# Patient Record
Sex: Male | Born: 1940 | Race: White | Marital: Single | Smoking: Never smoker
Health system: Southern US, Community
[De-identification: ages and names within clinical notes are randomized; demographics above are authoritative.]

## PROBLEM LIST (undated history)

## (undated) DIAGNOSIS — E119 Type 2 diabetes mellitus without complications: Secondary | ICD-10-CM

## (undated) DIAGNOSIS — C61 Malignant neoplasm of prostate: Secondary | ICD-10-CM

## (undated) DIAGNOSIS — E785 Hyperlipidemia, unspecified: Secondary | ICD-10-CM

## (undated) DIAGNOSIS — I10 Essential (primary) hypertension: Secondary | ICD-10-CM

## (undated) HISTORY — DX: Type 2 diabetes mellitus without complications: E11.9

## (undated) HISTORY — PX: HERNIA REPAIR: SHX51

## (undated) HISTORY — DX: Essential (primary) hypertension: I10

## (undated) HISTORY — DX: Hyperlipidemia, unspecified: E78.5

---

## 2010-08-19 DIAGNOSIS — E119 Type 2 diabetes mellitus without complications: Secondary | ICD-10-CM | POA: Insufficient documentation

## 2011-01-16 DIAGNOSIS — J309 Allergic rhinitis, unspecified: Secondary | ICD-10-CM | POA: Insufficient documentation

## 2014-09-29 DIAGNOSIS — E781 Pure hyperglyceridemia: Secondary | ICD-10-CM | POA: Insufficient documentation

## 2015-10-08 DIAGNOSIS — E782 Mixed hyperlipidemia: Secondary | ICD-10-CM | POA: Insufficient documentation

## 2016-10-01 DIAGNOSIS — G250 Essential tremor: Secondary | ICD-10-CM | POA: Insufficient documentation

## 2017-09-17 ENCOUNTER — Ambulatory Visit: Payer: Medicare PPO | Admitting: Urology

## 2017-09-17 ENCOUNTER — Encounter: Payer: Self-pay | Admitting: Urology

## 2017-09-17 VITALS — BP 138/77 | HR 68 | Ht 70.0 in | Wt 194.5 lb

## 2017-09-17 DIAGNOSIS — R972 Elevated prostate specific antigen [PSA]: Secondary | ICD-10-CM

## 2017-09-17 NOTE — Progress Notes (Signed)
09/17/2017 1:53 PM   Bobby Nichols 08/11/1941 [id number]  Referring provider: No referring provider defined for this encounter.  Chief Complaint  Patient presents with  . Elevated PSA    HPI: The patient is a 76 year old gentleman presents today for transfer of care of his elevated PSA.  He recently moved here from [place].  His PSA history is as below.  He has had a negative prostate biopsy in March 2014.  His PSA continued to rise and he had a prostate MRI in July 2017 which showed PI-RADS 3 and 4 lesions.  He underwent a fusion biopsy in August 2017 which did have one area of atypia.  I only have access to the patient's results and not to his previous physician records   PSA History:  9.66 - 3/15 11.1 - 12/16 16.7 - 6/17 13.8 - 12/17  He also has a history of low testosterone  and has been on testosterone in the past.  He is not currently on testosterone.  His last injection was in May 2018.   PMH: Past Medical History:  Diagnosis Date  . Diabetes mellitus without complication ([place])   . Hyperlipidemia   . Hypertension     Surgical History: Hernia 1957  Home Medications:  Allergies as of 09/17/2017   No Known Allergies     Medication List        Accurate as of 09/17/17  1:53 PM. Always use your most recent med list.          amLODipine 5 MG tablet Commonly known as:  NORVASC Take by mouth.   aspirin EC 81 MG tablet Take 81 mg by mouth daily.   atorvastatin 20 MG tablet Commonly known as:  LIPITOR TK 1 T PO  QHS   BYSTOLIC 20 MG Tabs Generic drug:  Nebivolol HCl TK 1 T PO  QD   metFORMIN 500 MG 24 hr tablet Commonly known as:  GLUCOPHAGE-XR   multivitamin tablet Take 1 tablet by mouth daily.   omeprazole 20 MG capsule Commonly known as:  PRILOSEC TK 1 C PO QD   PREVIDENT 5000 BOOSTER PLUS 1.1 % Pste Generic drug:  Sodium Fluoride       Allergies: No Known Allergies  Family History: Family History  Problem Relation Age of Onset    . Prostate cancer Neg Hx   . Bladder Cancer Neg Hx   . Kidney cancer Neg Hx     Social History:  reports that  has never smoked. he has never used smokeless tobacco. He reports that he does not drink alcohol or use drugs.  ROS: UROLOGY Frequent Urination?: No Hard to postpone urination?: No Burning/pain with urination?: No Get up at night to urinate?: Yes Leakage of urine?: No Urine stream starts and stops?: No Trouble starting stream?: No Do you have to strain to urinate?: No Blood in urine?: No Urinary tract infection?: No Sexually transmitted disease?: No Injury to kidneys or bladder?: No Painful intercourse?: No Weak stream?: No Erection problems?: Yes Penile pain?: No  Gastrointestinal Nausea?: No Vomiting?: No Indigestion/heartburn?: Yes Diarrhea?: No Constipation?: No  Constitutional Fever: No Night sweats?: No Weight loss?: No Fatigue?: No  Skin Skin rash/lesions?: No Itching?: No  Eyes Blurred vision?: No Double vision?: No  Ears/Nose/Throat Sore throat?: No Sinus problems?: No  Hematologic/Lymphatic Swollen glands?: No Easy bruising?: No  Cardiovascular Leg swelling?: No Chest pain?: No  Respiratory Cough?: No Shortness of breath?: No  Endocrine Excessive thirst?: No  Musculoskeletal Back pain?:  No Joint pain?: No  Neurological Headaches?: No Dizziness?: No  Psychologic Depression?: No Anxiety?: No  Physical Exam: BP 138/77 (BP Location: Right Arm, Patient Position: Sitting, Cuff Size: Normal)   Pulse 68   Ht 5\' 10"  (1.778 m)   Wt 194 lb 8 oz (88.2 kg)   BMI 27.91 kg/m   Constitutional:  Alert and oriented, No acute distress. HEENT: [place] AT, moist mucus membranes.  Trachea midline, no masses. Cardiovascular: No clubbing, cyanosis, or edema. Respiratory: Normal respiratory effort, no increased work of breathing. GI: Abdomen is soft, nontender, nondistended, no abdominal masses GU: No CVA tenderness.  Normal phallus.   Testicles are symmetrical bilaterally.  Benign.  DRE: 2+ benign. Skin: No rashes, bruises or suspicious lesions. Lymph: No cervical or inguinal adenopathy. Neurologic: Grossly intact, no focal deficits, moving all 4 extremities. Psychiatric: Normal mood and affect.  Laboratory Data: No results found for: WBC, HGB, HCT, MCV, PLT  No results found for: CREATININE  No results found for: PSA  No results found for: TESTOSTERONE  No results found for: HGBA1C  Urinalysis No results found for: COLORURINE, APPEARANCEUR, LABSPEC, PHURINE, GLUCOSEU, HGBUR, BILIRUBINUR, KETONESUR, PROTEINUR, UROBILINOGEN, NITRITE, LEUKOCYTESUR   Assessment & Plan:    1. Elevated PSA We will continue the patient's previous plan of checking his PSA every 6 months.  He is due today.  His PSA may in fact go down at this time since he is no longer on testosterone which he was at his last PSA check.  We will continue to monitor it.  If it rises, I would recommend repeating a prostate MRI locally.  2.  Hypogonadism Patient not currently interested in treatment.  If he does change his mind, I would want to get his full medical records regarding his hypogonadism treatment from his previous urologist.   Return in about 6 months (around 03/17/2018) for PSA prior.  Nickie Retort, MD  [facility] [address], [place], [place] [address] [phone]

## 2017-09-18 ENCOUNTER — Telehealth: Payer: Self-pay

## 2017-09-18 LAB — PSA: PROSTATE SPECIFIC AG, SERUM: 10.8 ng/mL — AB (ref 0.0–4.0)

## 2017-09-18 NOTE — Telephone Encounter (Signed)
Nickie Retort, MD  Toniann Fail C, LPN        Please let patient know his PSA lower that it has been in a few years at around 12. F/u as scheduled. Thanks.    Will send a letter.

## 2017-10-22 DIAGNOSIS — K635 Polyp of colon: Secondary | ICD-10-CM | POA: Insufficient documentation

## 2017-10-22 DIAGNOSIS — R251 Tremor, unspecified: Secondary | ICD-10-CM | POA: Insufficient documentation

## 2017-11-02 ENCOUNTER — Other Ambulatory Visit: Payer: Self-pay

## 2017-11-19 DIAGNOSIS — K219 Gastro-esophageal reflux disease without esophagitis: Secondary | ICD-10-CM | POA: Insufficient documentation

## 2017-11-19 DIAGNOSIS — Z1211 Encounter for screening for malignant neoplasm of colon: Secondary | ICD-10-CM | POA: Insufficient documentation

## 2018-03-16 ENCOUNTER — Other Ambulatory Visit: Payer: Medicare PPO

## 2018-03-16 DIAGNOSIS — R972 Elevated prostate specific antigen [PSA]: Secondary | ICD-10-CM

## 2018-03-17 LAB — PSA: Prostate Specific Ag, Serum: 10.6 ng/mL — ABNORMAL HIGH (ref 0.0–4.0)

## 2018-03-18 ENCOUNTER — Ambulatory Visit: Payer: Medicare PPO

## 2018-03-23 ENCOUNTER — Telehealth: Payer: Self-pay

## 2018-03-23 NOTE — Telephone Encounter (Signed)
Pt informed

## 2018-03-23 NOTE — Telephone Encounter (Signed)
-----   Message from Abbie Sons, MD sent at 03/23/2018 12:43 PM EDT ----- PSA stable at 10.6.  Keep scheduled follow-up appointment in July

## 2018-04-08 ENCOUNTER — Ambulatory Visit: Payer: Medicare PPO | Admitting: Urology

## 2018-05-12 ENCOUNTER — Ambulatory Visit: Payer: Medicare PPO | Admitting: Urology

## 2018-05-12 ENCOUNTER — Encounter: Payer: Self-pay | Admitting: Urology

## 2018-05-12 ENCOUNTER — Encounter

## 2018-05-12 VITALS — BP 122/73 | HR 64 | Ht 70.0 in | Wt 197.4 lb

## 2018-05-12 DIAGNOSIS — N4231 Prostatic intraepithelial neoplasia: Secondary | ICD-10-CM | POA: Diagnosis not present

## 2018-05-12 DIAGNOSIS — N4232 Atypical small acinar proliferation of prostate: Secondary | ICD-10-CM | POA: Diagnosis not present

## 2018-05-12 DIAGNOSIS — E291 Testicular hypofunction: Secondary | ICD-10-CM | POA: Diagnosis not present

## 2018-05-12 DIAGNOSIS — R972 Elevated prostate specific antigen [PSA]: Secondary | ICD-10-CM | POA: Insufficient documentation

## 2018-05-12 NOTE — Progress Notes (Signed)
05/12/2018 9:22 AM   Bobby Nichols 05/19/1941 [id number]  Referring provider: No referring provider defined for this encounter.  Chief Complaint  Patient presents with  . Elevated PSA   Urological history: 1. 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  PSA was 13.  Surveillance elected  2.  Hypogonadism   HPI: 77 year old male presents for follow-up of the above problem list.  He saw Dr. Pilar Jarvis in November 2018 and PSA was stable at 10.8.  He elected to discontinue TRT however states he felt much better when he was on replacement primarily better energy level and less fatigue.  He denies bothersome lower urinary tract symptoms.  He has no dysuria or gross hematuria.  PSA performed May 2019 was stable at 10.6.   PMH: Past Medical History:  Diagnosis Date  . Diabetes mellitus without complication ([place])   . Hyperlipidemia   . Hypertension     Surgical History: Past Surgical History:  Procedure Laterality Date  . HERNIA REPAIR      Home Medications:  Allergies as of 05/12/2018   No Known Allergies     Medication List        Accurate as of 05/12/18  9:22 AM. Always use your most recent med list.          amLODipine 5 MG tablet Commonly known as:  NORVASC Take by mouth.   aspirin EC 81 MG tablet Take 81 mg by mouth daily.   atorvastatin 20 MG tablet Commonly known as:  LIPITOR TK 1 T PO  QHS   BYSTOLIC 20 MG Tabs Generic drug:  Nebivolol HCl TK 1 T PO  QD   fluocinonide 0.05 % external solution Commonly known as:  LIDEX APP ON THE SKIN BID PRN   ketoconazole 2 % cream Commonly known as:  NIZORAL APPLY TO FACE BID PRN   metFORMIN 500 MG 24 hr tablet Commonly known as:  GLUCOPHAGE-XR   multivitamin tablet Take 1 tablet by mouth daily.   OMEGA-3 FATTY  ACIDS PO Take 500 mg by mouth.   omeprazole 20 MG capsule Commonly known as:  PRILOSEC TK 1 C PO QD   PREVIDENT 5000 BOOSTER PLUS 1.1 % Pste Generic drug:  Sodium Fluoride   propranolol 40 MG tablet Commonly known as:  INDERAL Take by mouth.       Allergies: No Known Allergies  Family History: Family History  Problem Relation Age of Onset  . Prostate cancer Neg Hx   . Bladder Cancer Neg Hx   . Kidney cancer Neg Hx     Social History:  reports that he has never smoked. He has never used smokeless tobacco. He reports that he does not drink alcohol or use drugs.  ROS: UROLOGY Frequent Urination?: No Hard to postpone urination?: No Burning/pain with urination?: No Get up at night to urinate?: No Leakage of urine?: No Urine stream starts and stops?: No Trouble starting stream?: No Do you have to strain to urinate?: No Blood in urine?: No Urinary tract infection?: No Sexually transmitted disease?: No Injury to kidneys or bladder?: No Painful intercourse?: No Weak stream?: No Erection problems?: Yes Penile pain?: No  Gastrointestinal Nausea?: No Vomiting?: No Indigestion/heartburn?: Yes Diarrhea?:  No Constipation?: No  Constitutional Fever: No Night sweats?: No Weight loss?: No Fatigue?: No  Skin Skin rash/lesions?: No Itching?: No  Eyes Blurred vision?: No Double vision?: No  Ears/Nose/Throat Sore throat?: No Sinus problems?: No  Hematologic/Lymphatic Swollen glands?: No Easy bruising?: No  Cardiovascular Leg swelling?: No Chest pain?: No  Respiratory Cough?: No Shortness of breath?: No  Endocrine Excessive thirst?: No  Musculoskeletal Back pain?: No Joint pain?: No  Neurological Headaches?: No Dizziness?: No  Psychologic Depression?: No Anxiety?: No  Physical Exam: BP 122/73 (BP Location: Left Arm, Patient Position: Sitting, Cuff Size: Normal)   Pulse 64   Ht 5\' 10"  (1.778 m)   Wt 197 lb 6.4 oz (89.5 kg)   BMI 28.32  kg/m   Constitutional:  Alert and oriented, No acute distress. HEENT: [place] AT, moist mucus membranes.  Trachea midline, no masses. Cardiovascular: No clubbing, cyanosis, or edema. Respiratory: Normal respiratory effort, no increased work of breathing. GI: Abdomen is soft, nontender, nondistended, no abdominal masses GU: No CVA tenderness. Prostate 60 g, smooth without nodules lymph: No cervical or inguinal lymphadenopathy. Skin: No rashes, bruises or suspicious lesions. Neurologic: Grossly intact, no focal deficits, moving all 4 extremities. Psychiatric: Normal mood and affect.   Assessment & Plan:   1.  Elevated PSA with high-grade PIN.  He has elected surveillance.  PSA/DRE stable.   2.  Hypogonadism.  He is interested in restarting testosterone replacement.  A testosterone level was drawn and if low he desires to restart office injections.  If he does restart will recheck his PSA 3 months after starting.   Abbie Sons, [place] [address], [place],  [address] [phone]

## 2018-05-13 LAB — TESTOSTERONE: TESTOSTERONE: 135 ng/dL — AB (ref 264–916)

## 2018-05-17 ENCOUNTER — Telehealth: Payer: Self-pay

## 2018-05-17 NOTE — Telephone Encounter (Signed)
Pt informed, please send testosterone to [facility]. He will bring it in for nurse visit.

## 2018-05-17 NOTE — Telephone Encounter (Signed)
-----   Message from Abbie Sons, MD sent at 05/16/2018  9:48 AM EDT ----- Testosterone level is low at 135.  He wanted to start TRT with office injections.  He can start 200 mg every 2 weeks.

## 2018-05-18 MED ORDER — TESTOSTERONE CYPIONATE 200 MG/ML IM SOLN: 200.0000 mg | INTRAMUSCULAR | 0 refills | Status: DC

## 2018-05-18 NOTE — Addendum Note (Signed)
Addended by: Abbie Sons on: 05/18/2018 07:31 AM   Modules accepted: Orders

## 2018-05-18 NOTE — Telephone Encounter (Signed)
rx sent

## 2018-05-25 ENCOUNTER — Telehealth: Payer: Self-pay

## 2018-05-25 NOTE — Telephone Encounter (Signed)
Prior Authorization received for testosterone, submitted via covermymeds.

## 2018-07-05 ENCOUNTER — Telehealth: Payer: Self-pay | Admitting: Urology

## 2018-07-05 NOTE — Telephone Encounter (Signed)
Pt called and they need more information from our office from [facility] besides PA.  They need 3 questions answered.  Have we received this?  Can you please give pt a call [phone]

## 2018-07-08 NOTE — Telephone Encounter (Signed)
I have spoke with pt, I will look for PA information.

## 2018-08-11 ENCOUNTER — Telehealth: Payer: Self-pay | Admitting: Urology

## 2018-08-11 NOTE — Telephone Encounter (Signed)
Pt called again and said he can't get RX.  Do you know if PA has been done?  Please call [phone] for PA

## 2018-08-16 NOTE — Telephone Encounter (Signed)
I contacted them and I am waiting on an approval,   Case Reference: [id number]  Will wait for determination.  Pt informed

## 2018-08-30 ENCOUNTER — Ambulatory Visit (INDEPENDENT_AMBULATORY_CARE_PROVIDER_SITE_OTHER): Payer: Medicare PPO

## 2018-08-30 VITALS — BP 127/77 | HR 65 | Wt 192.0 lb

## 2018-08-30 DIAGNOSIS — E291 Testicular hypofunction: Secondary | ICD-10-CM | POA: Diagnosis not present

## 2018-08-30 MED ORDER — TESTOSTERONE CYPIONATE 200 MG/ML IM SOLN
200.0000 mg | Freq: Once | INTRAMUSCULAR | Status: AC
  Administered 2018-08-30: 200 mg via INTRAMUSCULAR

## 2018-08-30 NOTE — Progress Notes (Signed)
Testosterone IM Injection  Due to Hypogonadism patient is present today for a Testosterone Injection.  Medication: Testosterone Cypionate Dose: 200mg  Location: right upper outer buttocks Lot: [id number] Exp:04/2020  Patient tolerated well, no complications were noted  Preformed by: Raylene Miyamoto (AAMA)  Follow up: 2 weeks

## 2018-08-30 NOTE — Telephone Encounter (Signed)
PA for Testosterone approved. Pt informed.

## 2018-09-13 ENCOUNTER — Ambulatory Visit (INDEPENDENT_AMBULATORY_CARE_PROVIDER_SITE_OTHER): Payer: Medicare PPO

## 2018-09-13 DIAGNOSIS — E291 Testicular hypofunction: Secondary | ICD-10-CM

## 2018-09-13 MED ORDER — TESTOSTERONE CYPIONATE 200 MG/ML IM SOLN
200.0000 mg | Freq: Once | INTRAMUSCULAR | Status: AC
  Administered 2018-09-13: 200 mg via INTRAMUSCULAR

## 2018-09-13 NOTE — Progress Notes (Signed)
Testosterone IM Injection  Due to Hypogonadism patient is present today for a Testosterone Injection.  Medication: Testosterone Cypionate Dose: 200mg / 24ml Location: right upper outer buttocks Lot: [id number] Exp:03/2020  Patient tolerated well, no complications were noted  Preformed by: Fonnie Jarvis, CMA  Follow up: 2 weeks

## 2018-09-27 ENCOUNTER — Ambulatory Visit (INDEPENDENT_AMBULATORY_CARE_PROVIDER_SITE_OTHER): Payer: Medicare PPO

## 2018-09-27 DIAGNOSIS — E291 Testicular hypofunction: Secondary | ICD-10-CM

## 2018-09-27 MED ORDER — TESTOSTERONE CYPIONATE 200 MG/ML IM SOLN
200.0000 mg | Freq: Once | INTRAMUSCULAR | Status: AC
  Administered 2018-09-27: 200 mg via INTRAMUSCULAR

## 2018-09-27 NOTE — Progress Notes (Signed)
Due to Hypogonadism patient is present today for a Testosterone Injection.  Medication: Testosterone Cypionate Dose: 200mg / 88ml Location: right upper outer buttocks Lot: [id number] Exp:02/2019  Patient tolerated well, no complications were noted, patient requested right side again  Preformed by: Fonnie Jarvis, CMA  Follow up: 2 weeks

## 2018-10-22 ENCOUNTER — Ambulatory Visit (INDEPENDENT_AMBULATORY_CARE_PROVIDER_SITE_OTHER): Payer: Medicare PPO

## 2018-10-22 VITALS — BP 132/79 | HR 69 | Wt 202.0 lb

## 2018-10-22 DIAGNOSIS — E291 Testicular hypofunction: Secondary | ICD-10-CM

## 2018-10-22 MED ORDER — TESTOSTERONE CYPIONATE 200 MG/ML IM SOLN
200.0000 mg | Freq: Once | INTRAMUSCULAR | Status: AC
  Administered 2018-10-22: 200 mg via INTRAMUSCULAR

## 2018-10-22 NOTE — Progress Notes (Signed)
Testosterone IM Injection  Patient is present today for an IM Injection for treatment of Hypogonadism. Drug: Testosterone Cypionate Dose:200mg  Location:left upper outer buttocks Lot: [id number] Exp:02/2019 Patient tolerated well, no complications were noted  Preformed by: Elizabeth Palau, CMA(AAMA)  Additional notes/ Follow up: as directed.

## 2018-10-28 DIAGNOSIS — C4441 Basal cell carcinoma of skin of scalp and neck: Secondary | ICD-10-CM

## 2018-10-28 DIAGNOSIS — C44619 Basal cell carcinoma of skin of left upper limb, including shoulder: Secondary | ICD-10-CM

## 2018-10-28 HISTORY — DX: Basal cell carcinoma of skin of left upper limb, including shoulder: C44.619

## 2018-10-28 HISTORY — DX: Basal cell carcinoma of skin of scalp and neck: C44.41

## 2018-11-05 ENCOUNTER — Ambulatory Visit (INDEPENDENT_AMBULATORY_CARE_PROVIDER_SITE_OTHER): Payer: Medicare PPO | Admitting: Family Medicine

## 2018-11-05 DIAGNOSIS — E291 Testicular hypofunction: Secondary | ICD-10-CM | POA: Diagnosis not present

## 2018-11-05 NOTE — Progress Notes (Signed)
Testosterone IM Injection  Due to Hypogonadism patient is present today for a Testosterone Injection.  Medication: Testosterone Cypionate Dose: 1ML Location: left upper outer buttocks Lot: [id number] Exp:06/2020  Patient tolerated well, no complications were noted  Preformed by: Elberta Leatherwood, CMA

## 2018-11-19 ENCOUNTER — Ambulatory Visit (INDEPENDENT_AMBULATORY_CARE_PROVIDER_SITE_OTHER): Payer: Medicare PPO | Admitting: Family Medicine

## 2018-11-19 DIAGNOSIS — E291 Testicular hypofunction: Secondary | ICD-10-CM | POA: Diagnosis not present

## 2018-11-19 MED ORDER — TESTOSTERONE CYPIONATE 200 MG/ML IM SOLN
200.0000 mg | Freq: Once | INTRAMUSCULAR | Status: AC
  Administered 2018-11-19: 200 mg via INTRAMUSCULAR

## 2018-11-19 NOTE — Progress Notes (Signed)
Testosterone IM Injection  Due to Hypogonadism patient is present today for a Testosterone Injection.  Medication: Testosterone Cypionate Dose: 1ML Location: right upper outer buttocks Lot: [id number] Exp:03/2020  Patient tolerated well, no complications were noted  Preformed by: Elberta Leatherwood, CMA  Follow up: 2 weeks

## 2018-11-30 ENCOUNTER — Other Ambulatory Visit: Payer: Self-pay | Admitting: Family Medicine

## 2018-12-02 ENCOUNTER — Telehealth: Payer: Self-pay | Admitting: Urology

## 2018-12-02 MED ORDER — TESTOSTERONE CYPIONATE 200 MG/ML IM SOLN: 200.0000 mg | INTRAMUSCULAR | 0 refills | Status: DC

## 2018-12-02 NOTE — Telephone Encounter (Signed)
Pt states he has appt tomorrow for testosterone injection, however he is out of his medication and states the pharmacy has contacted [place]. Please advise pt what he needs to do, is Rx going to be called in before tomorrow at 3pm? Please call pt @ [phone]

## 2018-12-02 NOTE — Telephone Encounter (Signed)
He has not had a testosterone level checked since starting TRT in November.  He needs a testosterone level prior to his injection tomorrow.

## 2018-12-03 ENCOUNTER — Ambulatory Visit (INDEPENDENT_AMBULATORY_CARE_PROVIDER_SITE_OTHER): Payer: Medicare PPO | Admitting: Family Medicine

## 2018-12-03 DIAGNOSIS — E291 Testicular hypofunction: Secondary | ICD-10-CM | POA: Diagnosis not present

## 2018-12-03 MED ORDER — TESTOSTERONE CYPIONATE 200 MG/ML IM SOLN
200.0000 mg | Freq: Once | INTRAMUSCULAR | Status: AC
  Administered 2018-12-03: 200 mg via INTRAMUSCULAR

## 2018-12-03 NOTE — Telephone Encounter (Signed)
Patient seen in office

## 2018-12-03 NOTE — Progress Notes (Signed)
Testosterone IM Injection  Due to Hypogonadism patient is present today for a Testosterone Injection.  Medication: Testosterone Cypionate Dose: 1ML Location: left upper outer buttocks Lot: [id number] Exp:07/2020  Patient tolerated well, no complications were noted  Preformed by: Elberta Leatherwood, CMA  Follow up: 2 weeks

## 2018-12-17 ENCOUNTER — Telehealth: Payer: Self-pay | Admitting: Urology

## 2018-12-17 ENCOUNTER — Ambulatory Visit: Payer: Medicare PPO

## 2018-12-17 ENCOUNTER — Ambulatory Visit (INDEPENDENT_AMBULATORY_CARE_PROVIDER_SITE_OTHER): Payer: Medicare PPO | Admitting: Urology

## 2018-12-17 DIAGNOSIS — E291 Testicular hypofunction: Secondary | ICD-10-CM | POA: Diagnosis not present

## 2018-12-17 MED ORDER — TESTOSTERONE CYPIONATE 200 MG/ML IM SOLN
200.0000 mg | Freq: Once | INTRAMUSCULAR | Status: AC
  Administered 2018-12-17: 200 mg via INTRAMUSCULAR

## 2018-12-17 NOTE — Telephone Encounter (Signed)
He has an Appt scheduled next week.

## 2018-12-17 NOTE — Progress Notes (Signed)
Testosterone IM Injection  Due to Hypogonadism patient is present today for a Testosterone Injection.  Medication: Testosterone Cypionate Dose: 1Ml Location: right upper outer buttocks Lot: [id number] Exp:07/2020  Patient tolerated well, no complications were noted  Preformed by: Elberta Leatherwood, CMA  Follow up: 2 weeks

## 2018-12-17 NOTE — Telephone Encounter (Signed)
Bobby Nichols needs to have a PSA drawn before anymore testosterone injections.

## 2018-12-20 ENCOUNTER — Other Ambulatory Visit: Payer: Self-pay

## 2018-12-20 DIAGNOSIS — E291 Testicular hypofunction: Secondary | ICD-10-CM

## 2018-12-20 DIAGNOSIS — R972 Elevated prostate specific antigen [PSA]: Secondary | ICD-10-CM

## 2018-12-21 ENCOUNTER — Other Ambulatory Visit: Payer: Medicare PPO

## 2018-12-21 DIAGNOSIS — E291 Testicular hypofunction: Secondary | ICD-10-CM

## 2018-12-21 DIAGNOSIS — R972 Elevated prostate specific antigen [PSA]: Secondary | ICD-10-CM

## 2018-12-22 ENCOUNTER — Telehealth: Payer: Self-pay | Admitting: Family Medicine

## 2018-12-22 DIAGNOSIS — R972 Elevated prostate specific antigen [PSA]: Secondary | ICD-10-CM

## 2018-12-22 DIAGNOSIS — N4232 Atypical small acinar proliferation of prostate: Secondary | ICD-10-CM

## 2018-12-22 DIAGNOSIS — N4231 Prostatic intraepithelial neoplasia: Secondary | ICD-10-CM

## 2018-12-22 LAB — TESTOSTERONE: Testosterone: 1133 ng/dL — ABNORMAL HIGH (ref 264–916)

## 2018-12-22 LAB — PSA: Prostate Specific Ag, Serum: 12.9 ng/mL — ABNORMAL HIGH (ref 0.0–4.0)

## 2018-12-22 NOTE — Telephone Encounter (Signed)
Patient notified to stop Testosterone injections at this time. Please review the PSA results and give instructions on what he should do next.

## 2018-12-22 NOTE — Telephone Encounter (Signed)
-----   Message from Nori Riis, PA-C sent at 12/22/2018  9:47 AM EST ----- Bobby Nichols needs to stop his testosterone injections and we need to run his results by Dr. Bernardo Heater for further instructions.

## 2018-12-23 NOTE — Telephone Encounter (Signed)
If he desires to continue testosterone he will need a repeat mri and possible biopsy. Let me know and I will put in order

## 2018-12-27 NOTE — Telephone Encounter (Signed)
Patient is willing to have the MRI if needed.

## 2018-12-28 NOTE — Telephone Encounter (Signed)
MRI order was entered.  Will call with results

## 2018-12-31 ENCOUNTER — Ambulatory Visit: Payer: Medicare PPO

## 2018-12-31 NOTE — Telephone Encounter (Signed)
Tried [employer] and the recording was not directing to the correct location and hung up. Will call again.

## 2018-12-31 NOTE — Telephone Encounter (Signed)
[employer] called [phone] and requests a call back, they have a clinical question. Case ID # [id number]. Please advise.

## 2019-03-29 ENCOUNTER — Telehealth: Payer: Self-pay | Admitting: Urology

## 2019-03-29 NOTE — Telephone Encounter (Signed)
Pt was having regular injections back in March.  He wants to know what he needs to do next.  He would like to resume injections, but not sure if he would need to come in for blood work first.

## 2019-03-29 NOTE — Telephone Encounter (Signed)
Spoke to patient and he is aware of the need for MRI. An order was place 12/28/18. Can you please look into this.

## 2019-03-29 NOTE — Telephone Encounter (Signed)
LMOM for patient to return call.

## 2019-03-29 NOTE — Telephone Encounter (Signed)
Mr. Bobby Nichols needs to have a prostate MRI prior to restarting any testosterone therapy.

## 2019-03-29 NOTE — Telephone Encounter (Signed)
The MRI order was closed and marked as expired order. I opened it back up and I will need to do a PA first before it can get scheduled. Not sure why is was closed.    Bobby Nichols

## 2019-08-23 ENCOUNTER — Telehealth: Payer: Self-pay | Admitting: Urology

## 2019-08-23 DIAGNOSIS — N4232 Atypical small acinar proliferation of prostate: Secondary | ICD-10-CM

## 2019-08-23 DIAGNOSIS — R972 Elevated prostate specific antigen [PSA]: Secondary | ICD-10-CM

## 2019-08-23 NOTE — Telephone Encounter (Signed)
Patient called asking about restarting his depo injections, I explained that he would still need to have the MRI prior to starting these. The last order expired because he never had the MRI done. He said due to [place] he never followed through. He now wants to proceed and so I will need a new order for the MRI.   Thanks, Sharyn Lull

## 2019-09-01 NOTE — Telephone Encounter (Signed)
MRI was approved and patient is aware Scheduling will call him to schedule and he will call me back to schedule his follow up with [facility]

## 2019-09-21 ENCOUNTER — Ambulatory Visit
Admission: RE | Admit: 2019-09-21 | Discharge: 2019-09-21 | Disposition: A | Discharge status: Home / Self Care | Payer: Medicare PPO | Source: Ambulatory Visit | Attending: Urology | Admitting: Urology

## 2019-09-21 ENCOUNTER — Other Ambulatory Visit: Payer: Self-pay

## 2019-09-21 DIAGNOSIS — R972 Elevated prostate specific antigen [PSA]: Secondary | ICD-10-CM | POA: Insufficient documentation

## 2019-09-21 DIAGNOSIS — N4232 Atypical small acinar proliferation of prostate: Secondary | ICD-10-CM | POA: Insufficient documentation

## 2019-09-21 LAB — POCT I-STAT CREATININE: Creatinine, Ser: 1.1 mg/dL (ref 0.61–1.24)

## 2019-09-21 MED ORDER — GADOBUTROL 1 MMOL/ML IV SOLN
9.0000 mL | Freq: Once | INTRAVENOUS | Status: AC | PRN
  Administered 2019-09-21: 9 mL via INTRAVENOUS

## 2019-09-28 ENCOUNTER — Ambulatory Visit: Payer: Medicare PPO | Admitting: Urology

## 2019-10-03 ENCOUNTER — Telehealth: Payer: Self-pay | Admitting: Urology

## 2019-10-03 DIAGNOSIS — R972 Elevated prostate specific antigen [PSA]: Secondary | ICD-10-CM

## 2019-10-03 DIAGNOSIS — R935 Abnormal findings on diagnostic imaging of other abdominal regions, including retroperitoneum: Secondary | ICD-10-CM

## 2019-10-03 NOTE — Telephone Encounter (Signed)
I contacted Bobby Nichols regarding his recent prostate MRI results.  There was a PI-RADS 5 lesion in the right central gland and a PI-RADS 3 lesion in the left central gland.  There was capsular bulging in the area of PI-RADS 5 lesion.  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  I recommended scheduling a repeat fusion biopsy.

## 2019-10-26 ENCOUNTER — Other Ambulatory Visit: Payer: Self-pay

## 2019-10-26 ENCOUNTER — Ambulatory Visit: Payer: Medicare Other | Admitting: Urology

## 2019-10-26 VITALS — BP 131/71 | HR 73 | Ht 70.0 in | Wt 198.0 lb

## 2019-10-26 DIAGNOSIS — R935 Abnormal findings on diagnostic imaging of other abdominal regions, including retroperitoneum: Secondary | ICD-10-CM | POA: Diagnosis not present

## 2019-10-26 DIAGNOSIS — R972 Elevated prostate specific antigen [PSA]: Secondary | ICD-10-CM | POA: Diagnosis not present

## 2019-10-26 DIAGNOSIS — N4231 Prostatic intraepithelial neoplasia: Secondary | ICD-10-CM

## 2019-10-26 DIAGNOSIS — N4232 Atypical small acinar proliferation of prostate: Secondary | ICD-10-CM | POA: Diagnosis not present

## 2019-10-26 NOTE — Progress Notes (Signed)
10/26/2019 11:40 AM   Bobby Nichols [birth year] [id number]  Referring provider: Baxter Hire, MD [place],  [place] [address]  Chief Complaint  Patient presents with  . Results    MRI    Urological history: 1. 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  PSA was 13.  Surveillance elected  2.  Hypogonadism   HPI: 79 y.o. male presents for follow-up of an elevated PSA and MRI results.  His testosterone was held after rising PSA was documented March 2020.  Prostate MRI performed 12/2 was remarkable for a 38 cc prostate.  A PI-RADS 5 lesion was noted in the anterior right mid gland with associated capsular bulging.  There was also a PI-RADS 3 lesion in the left mid gland.  I had contacted him in mid December and recommended a repeat fusion biopsy.  He states he is scheduled in February.   PMH: Past Medical History:  Diagnosis Date  . Diabetes mellitus without complication ([place])   . Hyperlipidemia   . Hypertension     Surgical History: Past Surgical History:  Procedure Laterality Date  . HERNIA REPAIR      Home Medications:  Allergies as of 10/26/2019   No Known Allergies     Medication List       Accurate as of October 26, 2019 11:40 AM. If you have any questions, ask your nurse or doctor.        STOP taking these medications   Bystolic 20 MG Tabs Generic drug: Nebivolol HCl Stopped by: Abbie Sons, MD   metFORMIN 500 MG 24 hr tablet Commonly known as: GLUCOPHAGE-XR Stopped by: Abbie Sons, MD     TAKE these medications   amLODipine 5 MG tablet Commonly known as: NORVASC Take by mouth.   aspirin EC 81 MG tablet Take 81 mg by mouth daily.   atorvastatin 20 MG tablet Commonly known as: LIPITOR TK 1 T PO  QHS   fluocinonide 0.05 %  external solution Commonly known as: LIDEX APP ON THE SKIN BID PRN   ketoconazole 2 % cream Commonly known as: NIZORAL APPLY TO FACE BID PRN   multivitamin tablet Take 1 tablet by mouth daily.   OMEGA-3 FATTY ACIDS PO Take 500 mg by mouth.   omeprazole 20 MG capsule Commonly known as: PRILOSEC TK 1 C PO QD   PreviDent 5000 Booster Plus 1.1 % Pste Generic drug: Sodium Fluoride   propranolol 40 MG tablet Commonly known as: INDERAL Take by mouth.   testosterone cypionate 200 MG/ML injection Commonly known as: DEPOTESTOSTERONE CYPIONATE Inject 1 mL (200 mg total) into the muscle every 14 (fourteen) days.       Allergies: No Known Allergies  Family History: Family History  Problem Relation Age of Onset  . Prostate cancer Neg Hx   . Bladder Cancer Neg Hx   . Kidney cancer Neg Hx     Social History:  reports that he has never smoked. He has never used smokeless tobacco. He reports that he does not drink alcohol or use drugs.  ROS: UROLOGY Frequent Urination?: No Hard to postpone urination?: No Burning/pain with urination?: No Get up at night to urinate?: No Leakage of urine?: No Urine stream starts and  stops?: No Trouble starting stream?: No Do you have to strain to urinate?: No Blood in urine?: No Urinary tract infection?: No Sexually transmitted disease?: No Injury to kidneys or bladder?: No Painful intercourse?: No Weak stream?: No Erection problems?: No Penile pain?: No  Gastrointestinal Nausea?: No Vomiting?: No Indigestion/heartburn?: No Diarrhea?: No Constipation?: No  Constitutional Fever: No Night sweats?: No Weight loss?: No Fatigue?: No  Skin Skin rash/lesions?: No Itching?: No  Eyes Blurred vision?: No Double vision?: No  Ears/Nose/Throat Sore throat?: No Sinus problems?: No  Hematologic/Lymphatic Swollen glands?: No Easy bruising?: No  Cardiovascular Leg swelling?: No Chest pain?: No  Respiratory Cough?:  No Shortness of breath?: No  Endocrine Excessive thirst?: No  Musculoskeletal Back pain?: No Joint pain?: No  Neurological Headaches?: No Dizziness?: No  Psychologic Depression?: No Anxiety?: No  Physical Exam: BP 131/71   Pulse 73   Ht 5\' 10"  (1.778 m)   Wt 198 lb (89.8 kg)   BMI 28.41 kg/m   Constitutional:  Alert and oriented, No acute distress. HEENT: [place] AT, moist mucus membranes.  Trachea midline, no masses. Cardiovascular: No clubbing, cyanosis, or edema. Respiratory: Normal respiratory effort, no increased work of breathing. GI: Abdomen is soft, nontender, nondistended, no abdominal masses    Assessment & Plan:   80 y.o. 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  Recent MRI does show a PI-RADS 5 lesion with capsular bulging.  He is scheduled for MR fusion biopsy in [place] next month.  All questions were answered and he will return here for biopsy results.  Abbie Sons, [place] [address] [place], [place] [address] [phone]

## 2019-10-29 ENCOUNTER — Encounter: Payer: Self-pay | Admitting: Urology

## 2019-10-29 DIAGNOSIS — R935 Abnormal findings on diagnostic imaging of other abdominal regions, including retroperitoneum: Secondary | ICD-10-CM | POA: Insufficient documentation

## 2019-11-10 ENCOUNTER — Other Ambulatory Visit: Payer: Self-pay | Admitting: Urology

## 2019-11-14 ENCOUNTER — Ambulatory Visit: Payer: Medicare Other | Admitting: Urology

## 2019-11-17 DIAGNOSIS — C44519 Basal cell carcinoma of skin of other part of trunk: Secondary | ICD-10-CM

## 2019-11-17 HISTORY — DX: Basal cell carcinoma of skin of other part of trunk: C44.519

## 2019-11-18 ENCOUNTER — Other Ambulatory Visit: Payer: Self-pay

## 2019-11-18 ENCOUNTER — Ambulatory Visit: Payer: Medicare Other | Admitting: Urology

## 2019-11-18 ENCOUNTER — Encounter: Payer: Self-pay | Admitting: Urology

## 2019-11-18 VITALS — BP 124/77 | HR 66 | Ht 70.0 in | Wt 198.0 lb

## 2019-11-18 DIAGNOSIS — C61 Malignant neoplasm of prostate: Secondary | ICD-10-CM | POA: Diagnosis not present

## 2019-11-18 NOTE — Progress Notes (Signed)
11/18/2019 9:32 AM   Bobby Nichols [birth year] [id number]  Referring provider: Baxter Hire, MD [place],  [place] [address]  Chief Complaint  Patient presents with  . Results    Urological history: 1.Elevated PSA -Benign biopsy 12/2012 for PSA in the 9-10 range -MRI 2017 for PSA 13.8 showing PI-RADS 3 and 4 lesions -Fusion biopsy showed multifocal high-grade PIN and a focus of atypia suspicious for adenocarcinoma.PSA was 13. Surveillance elected -Repeat MRI 12/20, 38 cc gland, PI-RADS 5 lesion right mid gland, PI-RADS 3 lesion left mid gland  2.Hypogonadism  HPI: 79 y.o. male status post MR fusion biopsy at [facility] 11/07/2019.  He had no post biopsy complaints.  Prostate volume by ultrasound was 52.1 g.  4 cores each of ROI lesions were obtained along with 12 core standard biopsies.  Pathology: -1/2 cores positive Gleason 3+4 adenocarcinoma left ROI (20%) -1/2 cores positive Gleason 3+3-second left ROI sample (20%) -14 cores with atypia right ROI -12 core biopsy RML core positive Gleason 3+3 (10%) -LB and RB cores with high-grade PIN  PMH: Past Medical History:  Diagnosis Date  . Diabetes mellitus without complication ([place])   . Hyperlipidemia   . Hypertension     Surgical History: Past Surgical History:  Procedure Laterality Date  . HERNIA REPAIR      Home Medications:  Allergies as of 11/18/2019   No Known Allergies     Medication List       Accurate as of November 18, 2019  9:32 AM. If you have any questions, ask your nurse or doctor.        amLODipine 5 MG tablet Commonly known as: NORVASC Take by mouth.   aspirin EC 81 MG tablet Take 81 mg by mouth daily.   atorvastatin 20 MG tablet Commonly known as: LIPITOR TK 1 T PO  QHS   fluocinonide 0.05 % external solution Commonly known as: LIDEX APP ON THE SKIN BID PRN   ketoconazole 2 % cream Commonly known as: NIZORAL APPLY TO FACE BID PRN   multivitamin  tablet Take 1 tablet by mouth daily.   OMEGA-3 FATTY ACIDS PO Take 500 mg by mouth.   omeprazole 20 MG capsule Commonly known as: PRILOSEC TK 1 C PO QD   PreviDent 5000 Booster Plus 1.1 % Pste Generic drug: Sodium Fluoride   propranolol 40 MG tablet Commonly known as: INDERAL Take by mouth.   testosterone cypionate 200 MG/ML injection Commonly known as: DEPOTESTOSTERONE CYPIONATE Inject 1 mL (200 mg total) into the muscle every 14 (fourteen) days.       Allergies: No Known Allergies  Family History: Family History  Problem Relation Age of Onset  . Prostate cancer Neg Hx   . Bladder Cancer Neg Hx   . Kidney cancer Neg Hx     Social History:  reports that he has never smoked. He has never used smokeless tobacco. He reports that he does not drink alcohol or use drugs.  ROS: No change 10/26/2019  Physical Exam: BP 124/77 (BP Location: Left Arm, Patient Position: Sitting, Cuff Size: Normal)   Pulse 66   Ht 5\' 10"  (1.778 m)   Wt 198 lb (89.8 kg)   BMI 28.41 kg/m   Constitutional:  Alert and oriented, No acute distress.  Assessment & Plan:    - T1c adenocarcinoma prostate The pathology report was discussed with Mr. Kuznetsov.  NCCN risk stratification is intermediate risk favorable.  His life expectancy is 10+ years and management  options were discussed including active surveillance, EBRT/brachytherapy and radical prostatectomy.  Based on his age would not recommend radical prostatectomy.  He has a long history of an elevated PSA with prior biopsies and has initially elected active surveillance.  We will see him back in 6 months for PSA/DRE.  Repeat prostate MRI approximately 10 months and will discuss confirmatory biopsy after MRI.  Greater than 50% of this 15-minute visit was spent counseling the patient.  - Hypogonadism He has held his testosterone until his biopsy and would not recommend TRT with intermediate risk prostate cancer on surveillance.   Abbie Sons, [place] [address] [place], [place] [address] [phone]

## 2020-04-04 ENCOUNTER — Other Ambulatory Visit: Payer: Self-pay

## 2020-04-04 MED ORDER — KETOCONAZOLE 2 % EX CREA: TOPICAL_CREAM | CUTANEOUS | 1 refills | Status: DC

## 2020-05-07 ENCOUNTER — Other Ambulatory Visit: Payer: Medicare Other

## 2020-05-07 ENCOUNTER — Other Ambulatory Visit: Payer: Self-pay

## 2020-05-07 DIAGNOSIS — C61 Malignant neoplasm of prostate: Secondary | ICD-10-CM

## 2020-05-08 LAB — PSA: Prostate Specific Ag, Serum: 15.1 ng/mL — ABNORMAL HIGH (ref 0.0–4.0)

## 2020-05-09 NOTE — Progress Notes (Signed)
05/10/2020 3:20 PM   Bobby Nichols 1941-05-21 [id number]  Referring provider: Baxter Hire, MD [place],  [place] [address] Chief Complaint  Patient presents with  . Prostate Cancer   Urological history: 1.Elevated PSA -Benign biopsy 12/2012 for PSA in the 9-10 range -MRI 2017 for PSA 13.8 showing PI-RADS 3 and 4 lesions -Fusion biopsy showed multifocal high-grade PIN and a focus of atypia suspicious for adenocarcinoma.PSA was 13. Surveillance elected -Repeat MRI 12/20, 38 cc gland, PI-RADS 5 lesion right mid gland, PI-RADS 3 lesion left mid gland  2.Hypogonadism  HPI: Bobby Nichols is a 79 y.o. male with prostate cancer s/p MR fusion biopsy at [facility] 11/07/2019. Patient returns today for a 6 month follow up.  -S/p MR fusion biopsy at [facility] 11/07/2019 with no post biopsy complaints.  -Prostate volume by ultrasound was 52.1 g.  4 cores each of ROI lesions were obtained along with 12 core standard biopsies. -PSA was 15.1 and of 05/07/2020.  Pathology: -1/2 cores positive Gleason 3+4 adenocarcinoma left ROI (20%) -1/2 cores positive Gleason 3+3-second left ROI sample (20%) -14 cores with atypia right ROI -12 core biopsy RML core positive Gleason 3+3 (10%) -LB and RB cores with high-grade PIN  PSA trend:  09/23/2016: 13.8 09/17/2017: 10.8 03/16/2018: 10.6 12/21/2018: 12.9 05/07/2020: 15.1   PMH: Past Medical History:  Diagnosis Date  . Basal cell carcinoma (BCC) of back 11/17/2019   Left upper back  . Basal cell carcinoma (BCC) of left shoulder 10/28/2018   [facility] 11/16/2018  . Basal cell carcinoma (BCC) of right side of neck 10/28/2018   [facility] 11/16/2018  . Diabetes mellitus without complication ([place])   . Hyperlipidemia   . Hypertension     Surgical History: Past Surgical History:  Procedure Laterality Date  . HERNIA REPAIR      Home Medications:  Allergies as of 05/10/2020   No Known Allergies     Medication List        Accurate as of May 10, 2020  3:20 PM. If you have any questions, ask your nurse or doctor.        amLODipine 5 MG tablet Commonly known as: NORVASC Take by mouth.   aspirin EC 81 MG tablet Take 81 mg by mouth daily.   atorvastatin 20 MG tablet Commonly known as: LIPITOR TK 1 T PO  QHS   fluocinonide 0.05 % external solution Commonly known as: LIDEX APP ON THE SKIN BID PRN   ketoconazole 2 % cream Commonly known as: NIZORAL APPLY TO FACE BID PRN   ketoconazole 2 % cream Commonly known as: NIZORAL Apply to the face BID PRN   multivitamin tablet Take 1 tablet by mouth daily.   OMEGA-3 FATTY ACIDS PO Take 500 mg by mouth.   omeprazole 20 MG capsule Commonly known as: PRILOSEC TK 1 C PO QD   PreviDent 5000 Booster Plus 1.1 % Pste Generic drug: Sodium Fluoride   propranolol 40 MG tablet Commonly known as: INDERAL Take by mouth.       Allergies: No Known Allergies  Family History: Family History  Problem Relation Age of Onset  . Prostate cancer Neg Hx   . Bladder Cancer Neg Hx   . Kidney cancer Neg Hx     Social History:  reports that he has never smoked. He has never used smokeless tobacco. He reports that he does not drink alcohol and does not use drugs.   Physical Exam: BP (!) 134/74   Pulse 62  Ht 5\' 10"  (1.778 m)   Wt 195 lb (88.5 kg)   BMI 27.98 kg/m   Constitutional:  Alert and oriented, No acute distress. HEENT: [place] AT, moist mucus membranes.  Trachea midline, no masses. Cardiovascular: No clubbing, cyanosis, or edema. Respiratory: Normal respiratory effort, no increased work of breathing. Skin: No rashes, bruises or suspicious lesions. Neurologic: Grossly intact, no focal deficits, moving all 4 extremities. Psychiatric: Normal mood and affect.   Assessment & Plan:    1. T1c adenocarcinoma prostate -His life expectancy is 10+ years and management options were discussed including active surveillance, EBRT/brachytherapy and radical  prostatectomy.  Based on his age would not recommend radical prostatectomy.  He has a long history of an elevated PSA with prior biopsies and has initially elected active surveillance.  -Recent PSA was 15.1 as of 05/07/2020.Due to slightly rising PSA I did a referral to radiation oncology with Dr. Baruch Gouty for further evaluation. Patient would like to wait 6 months before pursuing this option.  -We will see him back in 6 months for PSA.   [place] [address] [place], [place] [address] [phone]  I, Selena Batten, am acting as a scribe for Dr. Nicki Reaper C. Bayle Calvo,  I have reviewed the above documentation for accuracy and completeness, and I agree with the above.    Abbie Sons, MD

## 2020-05-10 ENCOUNTER — Other Ambulatory Visit: Payer: Self-pay

## 2020-05-10 ENCOUNTER — Ambulatory Visit: Payer: Medicare Other | Admitting: Urology

## 2020-05-10 ENCOUNTER — Encounter: Payer: Self-pay | Admitting: Urology

## 2020-05-10 VITALS — BP 134/74 | HR 62 | Ht 70.0 in | Wt 195.0 lb

## 2020-05-10 DIAGNOSIS — C61 Malignant neoplasm of prostate: Secondary | ICD-10-CM

## 2020-05-13 ENCOUNTER — Encounter: Payer: Self-pay | Admitting: Urology

## 2020-05-14 ENCOUNTER — Other Ambulatory Visit: Payer: Self-pay

## 2020-05-16 ENCOUNTER — Ambulatory Visit: Payer: Self-pay | Admitting: Urology

## 2020-05-17 ENCOUNTER — Encounter: Payer: Self-pay | Admitting: Dermatology

## 2020-07-11 ENCOUNTER — Ambulatory Visit: Payer: Medicare Other | Admitting: Dermatology

## 2020-07-11 ENCOUNTER — Other Ambulatory Visit: Payer: Self-pay

## 2020-07-11 ENCOUNTER — Encounter: Payer: Self-pay | Admitting: Dermatology

## 2020-07-11 DIAGNOSIS — Z85828 Personal history of other malignant neoplasm of skin: Secondary | ICD-10-CM

## 2020-07-11 DIAGNOSIS — L821 Other seborrheic keratosis: Secondary | ICD-10-CM

## 2020-07-11 DIAGNOSIS — L820 Inflamed seborrheic keratosis: Secondary | ICD-10-CM | POA: Diagnosis not present

## 2020-07-11 DIAGNOSIS — L814 Other melanin hyperpigmentation: Secondary | ICD-10-CM

## 2020-07-11 DIAGNOSIS — L570 Actinic keratosis: Secondary | ICD-10-CM | POA: Diagnosis not present

## 2020-07-11 DIAGNOSIS — D229 Melanocytic nevi, unspecified: Secondary | ICD-10-CM | POA: Diagnosis not present

## 2020-07-11 DIAGNOSIS — L578 Other skin changes due to chronic exposure to nonionizing radiation: Secondary | ICD-10-CM

## 2020-07-11 DIAGNOSIS — L738 Other specified follicular disorders: Secondary | ICD-10-CM

## 2020-07-11 DIAGNOSIS — D1800 Hemangioma unspecified site: Secondary | ICD-10-CM

## 2020-07-11 DIAGNOSIS — Z1283 Encounter for screening for malignant neoplasm of skin: Secondary | ICD-10-CM

## 2020-07-11 NOTE — Progress Notes (Signed)
Follow-Up Visit   Subjective  Bobby Nichols is a 79 y.o. male who presents for the following: Annual Exam (Hx BCC - patient has noticed some tenderness/soreness on the left side of his face). The patient presents for Total-Body Skin Exam (TBSE) for skin cancer screening and mole check.  The following portions of the chart were reviewed this encounter and updated as appropriate:  Tobacco  Allergies  Meds  Problems  Med Hx  Surg Hx  Fam Hx     Review of Systems:  No other skin or systemic complaints except as noted in HPI or Assessment and Plan.  Objective  Well appearing patient in no apparent distress; mood and affect are within normal limits.  A full examination was performed including scalp, head, eyes, ears, nose, lips, neck, chest, axillae, abdomen, back, buttocks, bilateral upper extremities, bilateral lower extremities, hands, feet, fingers, toes, fingernails, and toenails. All findings within normal limits unless otherwise noted below.  Objective  Face x 6 (6): Erythematous thin papules/macules with gritty scale.   Objective  Face x 2 (2): Erythematous keratotic or waxy stuck-on papule or plaque.   Objective  Face: Small yellow papules with a central dell.   Assessment & Plan  AK (actinic keratosis) (6) Face x 6  Destruction of lesion - Face x 6 Complexity: simple   Destruction method: cryotherapy   Informed consent: discussed and consent obtained   Timeout:  patient name, date of birth, surgical site, and procedure verified Lesion destroyed using liquid nitrogen: Yes   Region frozen until ice ball extended beyond lesion: Yes   Outcome: patient tolerated procedure well with no complications   Post-procedure details: wound care instructions given    Inflamed seborrheic keratosis (2) Face x 2  Destruction of lesion - Face x 2 Complexity: simple   Destruction method: cryotherapy   Informed consent: discussed and consent obtained   Timeout:  patient  name, date of birth, surgical site, and procedure verified Lesion destroyed using liquid nitrogen: Yes   Region frozen until ice ball extended beyond lesion: Yes   Outcome: patient tolerated procedure well with no complications   Post-procedure details: wound care instructions given    Sebaceous hyperplasia of face Face  Benign, observe.    Skin cancer screening   Lentigines - Scattered tan macules - Discussed due to sun exposure - Benign, observe - Call for any changes  Seborrheic Keratoses - Stuck-on, waxy, tan-brown papules and plaques  - Discussed benign etiology and prognosis. - Observe - Call for any changes  Melanocytic Nevi - Tan-brown and/or pink-flesh-colored symmetric macules and papules - Benign appearing on exam today - Observation - Call clinic for new or changing moles - Recommend daily use of broad spectrum spf 30+ sunscreen to sun-exposed areas.   Hemangiomas - Red papules - Discussed benign nature - Observe - Call for any changes  Actinic Damage - diffuse scaly erythematous macules with underlying dyspigmentation - Recommend daily broad spectrum sunscreen SPF 30+ to sun-exposed areas, reapply every 2 hours as needed.  - Call for new or changing lesions.   History of Basal Cell Carcinoma of the Skin - No evidence of recurrence today - Recommend regular full body skin exams - Recommend daily broad spectrum sunscreen SPF 30+ to sun-exposed areas, reapply every 2 hours as needed.  - Call if any new or changing lesions are noted between office visits  Skin cancer screening performed today.  Return in about 6 months (around 01/08/2021) for UBSE.  I,  Rudell Cobb, CMA, am acting as scribe for Sarina Ser, MD .  Documentation: I have reviewed the above documentation for accuracy and completeness, and I agree with the above.  Sarina Ser, MD

## 2020-07-12 ENCOUNTER — Encounter: Payer: Self-pay | Admitting: Dermatology

## 2020-11-09 ENCOUNTER — Other Ambulatory Visit: Payer: Self-pay

## 2020-11-09 DIAGNOSIS — C61 Malignant neoplasm of prostate: Secondary | ICD-10-CM

## 2020-11-12 ENCOUNTER — Other Ambulatory Visit: Payer: Medicare Other

## 2020-11-12 ENCOUNTER — Other Ambulatory Visit: Payer: Self-pay

## 2020-11-12 DIAGNOSIS — C61 Malignant neoplasm of prostate: Secondary | ICD-10-CM

## 2020-11-13 ENCOUNTER — Telehealth: Payer: Self-pay | Admitting: *Deleted

## 2020-11-13 LAB — PSA: Prostate Specific Ag, Serum: 11.7 ng/mL — ABNORMAL HIGH (ref 0.0–4.0)

## 2020-11-13 NOTE — Telephone Encounter (Signed)
Notified patient as instructed, patient pleased. Discussed follow-up appointments, patient agrees  

## 2020-11-13 NOTE — Telephone Encounter (Signed)
-----   Message from Nori Riis, PA-C sent at 11/13/2020 10:04 AM EST ----- Please let Bobby Nichols that his PSA remains elevated 11.7.  Would he like [place] to refer him to Dr. Donella Stade at this time?

## 2020-11-20 ENCOUNTER — Other Ambulatory Visit: Payer: Self-pay | Admitting: Dermatology

## 2020-12-27 ENCOUNTER — Ambulatory Visit: Payer: Medicare Other | Admitting: Dermatology

## 2020-12-27 ENCOUNTER — Other Ambulatory Visit: Payer: Self-pay

## 2020-12-27 ENCOUNTER — Encounter: Payer: Self-pay | Admitting: Dermatology

## 2020-12-27 DIAGNOSIS — L219 Seborrheic dermatitis, unspecified: Secondary | ICD-10-CM

## 2020-12-27 DIAGNOSIS — Z85828 Personal history of other malignant neoplasm of skin: Secondary | ICD-10-CM

## 2020-12-27 DIAGNOSIS — L578 Other skin changes due to chronic exposure to nonionizing radiation: Secondary | ICD-10-CM

## 2020-12-27 DIAGNOSIS — L720 Epidermal cyst: Secondary | ICD-10-CM | POA: Diagnosis not present

## 2020-12-27 DIAGNOSIS — L570 Actinic keratosis: Secondary | ICD-10-CM

## 2020-12-27 DIAGNOSIS — Z1283 Encounter for screening for malignant neoplasm of skin: Secondary | ICD-10-CM | POA: Diagnosis not present

## 2020-12-27 DIAGNOSIS — L821 Other seborrheic keratosis: Secondary | ICD-10-CM

## 2020-12-27 DIAGNOSIS — D1800 Hemangioma unspecified site: Secondary | ICD-10-CM

## 2020-12-27 DIAGNOSIS — L814 Other melanin hyperpigmentation: Secondary | ICD-10-CM

## 2020-12-27 DIAGNOSIS — L918 Other hypertrophic disorders of the skin: Secondary | ICD-10-CM

## 2020-12-27 DIAGNOSIS — D229 Melanocytic nevi, unspecified: Secondary | ICD-10-CM

## 2020-12-27 MED ORDER — TRIAMCINOLONE ACETONIDE 0.1 % EX CREA: 1.0000 "application " | TOPICAL_CREAM | Freq: Two times a day (BID) | CUTANEOUS | 2 refills | Status: DC | PRN

## 2020-12-27 NOTE — Progress Notes (Signed)
Follow-Up Visit   Subjective  Bobby Nichols is a 80 y.o. male who presents for the following: 6 month upper body exam (Patient states he has a spot on corner right eye, behind right ear, and right temple. Patient reports a small growth right axilla.).  He notes significant itching of his ears which is bothersome. He has dandruff at the scalp for years which responds well to dandruff shampoo.   Patient here for total body skin exam and skin cancer screening.  The following portions of the chart were reviewed this encounter and updated as appropriate:  Tobacco  Allergies  Meds  Problems  Med Hx  Surg Hx  Fam Hx      Objective  Well appearing patient in no apparent distress; mood and affect are within normal limits.  A full examination was performed including scalp, head, eyes, ears, nose, lips, neck, chest, axillae, abdomen, back, buttocks, bilateral upper extremities, bilateral lower extremities, hands, feet, fingers, toes, fingernails, and toenails. All findings within normal limits unless otherwise noted below.  Objective  bilateral ear, bilateral eyebrows: Pink patches with greasy scale at bilateral ear canal   Objective  right lateral lower eyelid x1, preauricular x 2, right cheek x 1 (4): Erythematous thin papules/macules with gritty scale.   Objective  Right Axilla: Fleshy, skin-colored pedunculated papules.    Objective  right nasofacial angle: Smooth pink-white papule  Assessment & Plan  Seborrheic dermatitis bilateral ear, bilateral eyebrows  Chronic condition with duration or expected duration over one year. Condition is bothersome to patient. Currently flared.  No cure, only control. Advised will need to treat intermittently.   Start TMC 0.1 % cream apply to affected areas of bilateral ears and eyebrows twice daily as needed for up to 2 weeks. Avoid applying to face, groin, and axilla. Use as directed. Risk of skin atrophy with long-term use reviewed.  80 g 2 rf  Topical steroids (such as triamcinolone, fluocinolone, fluocinonide, mometasone, clobetasol, halobetasol, betamethasone, hydrocortisone) can cause thinning and lightening of the skin if they are used for too long in the same area. Your physician has selected the right strength medicine for your problem and area affected on the body. Please use your medication only as directed by your physician to prevent side effects.      triamcinolone (KENALOG) 0.1 % - bilateral ear, bilateral eyebrows  Actinic keratosis (4) right lateral lower eyelid x1, preauricular x 2, right cheek x 1  Prior to procedure, discussed risks of blister formation, small wound, skin dyspigmentation, or rare scar following cryotherapy.    Destruction of lesion - right lateral lower eyelid x1, preauricular x 2, right cheek x 1  Destruction method: cryotherapy   Informed consent: discussed and consent obtained   Lesion destroyed using liquid nitrogen: Yes   Cryotherapy cycles:  2 Outcome: patient tolerated procedure well with no complications   Post-procedure details: wound care instructions given    Acrochordon Right Axilla  Benign-appearing.  Observation.  Call clinic for new or changing lesions.  Recommend daily use of broad spectrum spf 30+ sunscreen to sun-exposed areas.    Milia right nasofacial angle  Vs angiofibroma   Benign-appearing.  Observation.  Call clinic for new or changing lesions.  Recommend daily use of broad spectrum spf 30+ sunscreen to sun-exposed areas.    Lentigines Chest  - Scattered tan macules - Due to sun exposure - Benign-appering, observe - Recommend daily broad spectrum sunscreen SPF 30+ to sun-exposed areas, reapply every 2 hours as  needed. - Call for any changes  Seborrheic Keratoses Left side neck, left face  - Stuck-on, waxy, tan-brown papules and plaques  - Discussed benign etiology and prognosis. - Observe - Call for any changes  Melanocytic Nevi -  Tan-brown and/or pink-flesh-colored symmetric macules and papules - Benign appearing on exam today - Observation - Call clinic for new or changing moles - Recommend daily use of broad spectrum spf 30+ sunscreen to sun-exposed areas.   Hemangiomas Right neck - Red papules - Discussed benign nature - Observe - Call for any changes  Actinic Damage - Chronic, secondary to cumulative UV/sun exposure - diffuse scaly erythematous macules with underlying dyspigmentation - Recommend daily broad spectrum sunscreen SPF 30+ to sun-exposed areas, reapply every 2 hours as needed.  - Call for new or changing lesions.  History of Basal Cell Carcinoma of the Skin Right side of neck, left shoulder, left upper back - No evidence of recurrence today - Recommend regular full body skin exams - Recommend daily broad spectrum sunscreen SPF 30+ to sun-exposed areas, reapply every 2 hours as needed.  - Call if any new or changing lesions are noted between office visits   Skin cancer screening performed today.  Return in about 6 months (around 06/29/2021) for tbse recheck aks at face .  I, Ruthell Rummage, CMA, am acting as scribe for Forest Gleason, MD.  Documentation: I have reviewed the above documentation for accuracy and completeness, and I agree with the above.  Forest Gleason, MD

## 2020-12-27 NOTE — Patient Instructions (Addendum)
Cryotherapy Aftercare  . Wash gently with soap and water everyday.   Marland Kitchen Apply Vaseline and Band-Aid daily until healed.  Melanoma ABCDEs  Melanoma is the most dangerous type of skin cancer, and is the leading cause of death from skin disease.  You are more likely to develop melanoma if you:  Have light-colored skin, light-colored eyes, or red or blond hair  Spend a lot of time in the sun  Tan regularly, either outdoors or in a tanning bed  Have had blistering sunburns, especially during childhood  Have a close family member who has had a melanoma  Have atypical moles or large birthmarks  Early detection of melanoma is key since treatment is typically straightforward and cure rates are extremely high if we catch it early.   The first sign of melanoma is often a change in a mole or a new dark spot.  The ABCDE system is a way of remembering the signs of melanoma.  A for asymmetry:  The two halves do not match. B for border:  The edges of the growth are irregular. C for color:  A mixture of colors are present instead of an even brown color. D for diameter:  Melanomas are usually (but not always) greater than 40mm - the size of a pencil eraser. E for evolution:  The spot keeps changing in size, shape, and color.  Please check your skin once per month between visits. You can use a small mirror in front and a large mirror behind you to keep an eye on the back side or your body.   If you see any new or changing lesions before your next follow-up, please call to schedule a visit.  Please continue daily skin protection including broad spectrum sunscreen SPF 30+ to sun-exposed areas, reapplying every 2 hours as needed when you're outdoors.   Staying in the shade or wearing long sleeves, sun glasses (UVA+UVB protection) and wide brim hats (4-inch brim around the entire circumference of the hat) are also recommended for sun protection.   Recommend taking Heliocare sun protection supplement  daily in sunny weather for additional sun protection. For maximum protection on the sunniest days, you can take up to 2 capsules of regular Heliocare OR take 1 capsule of Heliocare Ultra. For prolonged exposure (such as a full day in the sun), you can repeat your dose of the supplement 4 hours after your first dose. Heliocare can be purchased at [facility] or at [email or web address].   Recommend Nicotinamide 500mg  twice per day to lower risk of non-melanoma skin cancer by approximately 25%.   Topical steroids (such as triamcinolone, fluocinolone, fluocinonide, mometasone, clobetasol, halobetasol, betamethasone, hydrocortisone) can cause thinning and lightening of the skin if they are used for too long in the same area. Your physician has selected the right strength medicine for your problem and area affected on the body. Please use your medication only as directed by your physician to prevent side effects.  Avoid applying to face, groin, and axilla. Use as directed. Risk of skin atrophy with long-term use reviewed.

## 2020-12-28 ENCOUNTER — Other Ambulatory Visit: Payer: Self-pay | Admitting: Dermatology

## 2020-12-31 NOTE — Telephone Encounter (Signed)
Patient requesting refill for ketoconazole shampoo. Could not find on patient's current medication list or prior office visit. Routing to provider to verify and approve.

## 2021-03-09 ENCOUNTER — Other Ambulatory Visit: Payer: Self-pay | Admitting: Dermatology

## 2021-06-27 ENCOUNTER — Other Ambulatory Visit: Payer: Self-pay | Admitting: Dermatology

## 2021-07-04 ENCOUNTER — Ambulatory Visit: Payer: Medicare Other | Admitting: Dermatology

## 2021-07-10 ENCOUNTER — Other Ambulatory Visit: Payer: Self-pay

## 2021-07-10 ENCOUNTER — Encounter: Payer: Self-pay | Admitting: Dermatology

## 2021-07-10 ENCOUNTER — Ambulatory Visit: Payer: Medicare Other | Admitting: Dermatology

## 2021-07-10 DIAGNOSIS — L821 Other seborrheic keratosis: Secondary | ICD-10-CM

## 2021-07-10 DIAGNOSIS — L820 Inflamed seborrheic keratosis: Secondary | ICD-10-CM

## 2021-07-10 DIAGNOSIS — L814 Other melanin hyperpigmentation: Secondary | ICD-10-CM

## 2021-07-10 DIAGNOSIS — D229 Melanocytic nevi, unspecified: Secondary | ICD-10-CM

## 2021-07-10 DIAGNOSIS — Z85828 Personal history of other malignant neoplasm of skin: Secondary | ICD-10-CM

## 2021-07-10 DIAGNOSIS — L570 Actinic keratosis: Secondary | ICD-10-CM

## 2021-07-10 DIAGNOSIS — Z1283 Encounter for screening for malignant neoplasm of skin: Secondary | ICD-10-CM | POA: Diagnosis not present

## 2021-07-10 DIAGNOSIS — L578 Other skin changes due to chronic exposure to nonionizing radiation: Secondary | ICD-10-CM

## 2021-07-10 DIAGNOSIS — D1800 Hemangioma unspecified site: Secondary | ICD-10-CM

## 2021-07-10 NOTE — Patient Instructions (Addendum)
Actinic keratoses are precancerous spots that appear secondary to cumulative UV radiation exposure/sun exposure over time. They are chronic with expected duration over 1 year. A portion of actinic keratoses will progress to squamous cell carcinoma of the skin. It is not possible to reliably predict which spots will progress to skin cancer and so treatment is recommended to prevent development of skin cancer.  Recommend daily broad spectrum sunscreen SPF 30+ to sun-exposed areas, reapply every 2 hours as needed.  Recommend staying in the shade or wearing long sleeves, sun glasses (UVA+UVB protection) and wide brim hats (4-inch brim around the entire circumference of the hat). Call for new or changing lesions.   Cryotherapy Aftercare  Wash gently with soap and water everyday.   Apply Vaseline and Band-Aid daily until healed.  Recommend Niacinamide or Nicotinamide 500mg  twice per day to lower risk of non-melanoma skin cancer by approximately 25%. This is usually available at [employer].   Recommend taking Heliocare sun protection supplement daily in sunny weather for additional sun protection. For maximum protection on the sunniest days, you can take up to 2 capsules of regular Heliocare OR take 1 capsule of Heliocare Ultra. For prolonged exposure (such as a full day in the sun), you can repeat your dose of the supplement 4 hours after your first dose. Heliocare can be purchased at [facility] or at [email or web address].     Melanoma ABCDEs  Melanoma is the most dangerous type of skin cancer, and is the leading cause of death from skin disease.  You are more likely to develop melanoma if you: Have light-colored skin, light-colored eyes, or red or blond hair Spend a lot of time in the sun Tan regularly, either outdoors or in a tanning bed Have had blistering sunburns, especially during childhood Have a close family member who has had a melanoma Have atypical moles or large  birthmarks  Early detection of melanoma is key since treatment is typically straightforward and cure rates are extremely high if we catch it early.   The first sign of melanoma is often a change in a mole or a new dark spot.  The ABCDE system is a way of remembering the signs of melanoma.  A for asymmetry:  The two halves do not match. B for border:  The edges of the growth are irregular. C for color:  A mixture of colors are present instead of an even brown color. D for diameter:  Melanomas are usually (but not always) greater than 67mm - the size of a pencil eraser. E for evolution:  The spot keeps changing in size, shape, and color.  Please check your skin once per month between visits. You can use a small mirror in front and a large mirror behind you to keep an eye on the back side or your body.   If you see any new or changing lesions before your next follow-up, please call to schedule a visit.  Please continue daily skin protection including broad spectrum sunscreen SPF 30+ to sun-exposed areas, reapplying every 2 hours as needed when you're outdoors.   Staying in the shade or wearing long sleeves, sun glasses (UVA+UVB protection) and wide brim hats (4-inch brim around the entire circumference of the hat) are also recommended for sun protection.    If you have any questions or concerns for your doctor, please call our main line at [phone] and press option 4 to reach your doctor's medical assistant. If no one answers, please leave a voicemail  as directed and we will return your call as soon as possible. Messages left after 4 pm will be answered the following business day.   You may also send [place] a message via [place]. We typically respond to MyChart messages within 1-2 business days.  For prescription refills, please ask your pharmacy to contact our office. Our fax number is [phone].  If you have an urgent issue when the clinic is closed that cannot wait until the next business  day, you can page your doctor at the number below.    Please note that while we do our best to be available for urgent issues outside of office hours, we are not available 24/7.   If you have an urgent issue and are unable to reach [place], you may choose to seek medical care at your doctor's office, retail clinic, urgent care center, or emergency room.  If you have a medical emergency, please immediately call 911 or go to the emergency department.  Pager Numbers  - Dr. Nehemiah Massed: [phone]  - Dr. Laurence Ferrari: [phone]  - Dr. Nicole Kindred: [phone]  In the event of inclement weather, please call our main line at [phone] for an update on the status of any delays or closures.  Dermatology Medication Tips: Please keep the boxes that topical medications come in in order to help keep track of the instructions about where and how to use these. Pharmacies typically print the medication instructions only on the boxes and not directly on the medication tubes.   If your medication is too expensive, please contact our office at [phone] option 4 or send [place] a message through [place].   We are unable to tell what your co-pay for medications will be in advance as this is different depending on your insurance coverage. However, we may be able to find a substitute medication at lower cost or fill out paperwork to get insurance to cover a needed medication.   If a prior authorization is required to get your medication covered by your insurance company, please allow [place] 1-2 business days to complete this process.  Drug prices often vary depending on where the prescription is filled and some pharmacies may offer cheaper prices.  The website www.goodrx.com contains coupons for medications through different pharmacies. The prices here do not account for what the cost may be with help from insurance (it may be cheaper with your insurance), but the website can give you the price if you did not use any  insurance.  - You can print the associated coupon and take it with your prescription to the pharmacy.  - You may also stop by our office during regular business hours and pick up a GoodRx coupon card.  - If you need your prescription sent electronically to a different pharmacy, notify our office through [facility] or by phone at [phone] option 4.

## 2021-07-10 NOTE — Progress Notes (Signed)
Follow-Up Visit   Subjective  Bobby Nichols is a 80 y.o. male who presents for the following: Follow-up (Patient here today for 6 month tbse. Patient here has a spot by right side if nose , right eyebrow area and states some places behind ears. ). Some of these at the face are irritated.  Patient here for full body skin exam and skin cancer screening.  The following portions of the chart were reviewed this encounter and updated as appropriate:  Tobacco  Allergies  Meds  Problems  Med Hx  Surg Hx  Fam Hx      Review of Systems: No other skin or systemic complaints except as noted in HPI or Assessment and Plan.   Objective  Well appearing patient in no apparent distress; mood and affect are within normal limits.  A full examination was performed including scalp, head, eyes, ears, nose, lips, neck, chest, axillae, abdomen, back, buttocks, bilateral upper extremities, bilateral lower extremities, hands, feet, fingers, toes, fingernails, and toenails. All findings within normal limits unless otherwise noted below.  Left Zygoma x 1, right temple x 1, right helix x 1, right lateral zygoma x 1 (4) Erythematous thin papules/macules with gritty scale.   left temple x 1, right forehead above eyebrow x 1, left temporal scalp x 1, right nasal bridge x 1 (4) Erythematous keratotic or waxy stuck-on papule or plaque.   Assessment & Plan  Actinic keratosis (4) Left Zygoma x 1, right temple x 1, right helix x 1, right lateral zygoma x 1  Actinic keratoses are precancerous spots that appear secondary to cumulative UV radiation exposure/sun exposure over time. They are chronic with expected duration over 1 year. A portion of actinic keratoses will progress to squamous cell carcinoma of the skin. It is not possible to reliably predict which spots will progress to skin cancer and so treatment is recommended to prevent development of skin cancer.  Recommend daily broad spectrum sunscreen SPF 30+ to  sun-exposed areas, reapply every 2 hours as needed.  Recommend staying in the shade or wearing long sleeves, sun glasses (UVA+UVB protection) and wide brim hats (4-inch brim around the entire circumference of the hat). Call for new or changing lesions.  Prior to procedure, discussed risks of blister formation, small wound, skin dyspigmentation, or rare scar following cryotherapy. Recommend Vaseline ointment to treated areas while healing.   Destruction of lesion - Left Zygoma x 1, right temple x 1, right helix x 1, right lateral zygoma x 1 Complexity: simple   Destruction method: cryotherapy   Informed consent: discussed and consent obtained   Timeout:  patient name, date of birth, surgical site, and procedure verified Lesion destroyed using liquid nitrogen: Yes   Region frozen until ice ball extended beyond lesion: Yes   Outcome: patient tolerated procedure well with no complications   Post-procedure details: wound care instructions given   Additional details:  Prior to procedure, discussed risks of blister formation, small wound, skin dyspigmentation, or rare scar following cryotherapy. Recommend Vaseline ointment to treated areas while healing.   Inflamed seborrheic keratosis left temple x 1, right forehead above eyebrow x 1, left temporal scalp x 1, right nasal bridge x 1  Prior to procedure, discussed risks of blister formation, small wound, skin dyspigmentation, or rare scar following cryotherapy. Recommend Vaseline ointment to treated areas while healing.   Destruction of lesion - left temple x 1, right forehead above eyebrow x 1, left temporal scalp x 1, right nasal bridge x 1  Destruction method: cryotherapy   Informed consent: discussed and consent obtained   Lesion destroyed using liquid nitrogen: Yes   Cryotherapy cycles:  2 Outcome: patient tolerated procedure well with no complications   Post-procedure details: wound care instructions given   Additional details:  Prior  to procedure, discussed risks of blister formation, small wound, skin dyspigmentation, or rare scar following cryotherapy. Recommend Vaseline ointment to treated areas while healing.        Lentigines - Scattered tan macules - Due to sun exposure - Benign-appearing, observe - Recommend daily broad spectrum sunscreen SPF 30+ to sun-exposed areas, reapply every 2 hours as needed. - Call for any changes  Seborrheic Keratoses - Stuck-on, waxy, tan-brown papules and/or plaques  - Benign-appearing - Discussed benign etiology and prognosis. - Observe - Call for any changes  Melanocytic Nevi - Tan-brown and/or pink-flesh-colored symmetric macules and papules - Benign appearing on exam today - Observation - Call clinic for new or changing moles - Recommend daily use of broad spectrum spf 30+ sunscreen to sun-exposed areas.   Hemangiomas - Red papules - Discussed benign nature - Observe - Call for any changes  Actinic Damage - Chronic condition, secondary to cumulative UV/sun exposure - diffuse scaly erythematous macules with underlying dyspigmentation - Recommend daily broad spectrum sunscreen SPF 30+ to sun-exposed areas, reapply every 2 hours as needed.  - Staying in the shade or wearing long sleeves, sun glasses (UVA+UVB protection) and wide brim hats (4-inch brim around the entire circumference of the hat) are also recommended for sun protection.  - Call for new or changing lesions.  History of Basal Cell Carcinoma of the Skin - No evidence of recurrence today - Recommend regular full body skin exams - Recommend daily broad spectrum sunscreen SPF 30+ to sun-exposed areas, reapply every 2 hours as needed.  - Call if any new or changing lesions are noted between office visits - Recommend Niacinamide or Nicotinamide 500mg  twice per day to lower risk of non-melanoma skin cancer by approximately 25%. This is usually available at [employer].  Skin cancer screening performed  today.  Return for 4 month ak follow up, 1 year tbse . I, Ruthell Rummage, CMA, am acting as scribe for Forest Gleason, MD.  Documentation: I have reviewed the above documentation for accuracy and completeness, and I agree with the above.  Forest Gleason, MD

## 2021-07-11 ENCOUNTER — Ambulatory Visit: Payer: Medicare Other | Admitting: Dermatology

## 2021-07-25 IMAGING — MR MR PROSTATE WO/W CM
18 series · 48 of 48 positions shown · IV contrast (9ml Gadavist)
Comparison: None.

CLINICAL DATA: PSA greater than 3. Prior benign workup. History of
hypogonadism with testosterone injection.

EXAM:
MR PROSTATE WITHOUT AND WITH CONTRAST
TECHNIQUE: Multiplanar multisequence MRI images were obtained of the pelvis
centered about the prostate. Pre and post contrast images were
obtained.
CONTRAST:  9mL GADAVIST GADOBUTROL 1 MMOL/ML IV SOLN

[Series 6: T2 · sagittal · 3.5mm · 0.62mm/px · 2 of 35 slices shown]
[im 1/35]
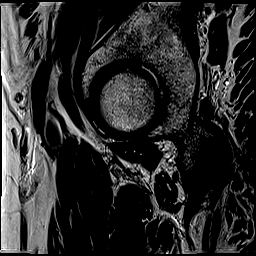
[im 35/35]
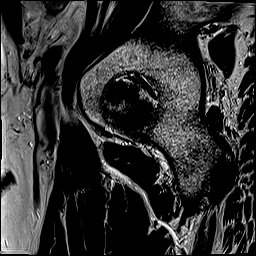

[Series 7: T1 · axial · 3.0mm · 0.35mm/px · z∈[-75,+27]mm · 2 of 30 slices shown (1 of 17)]
[im 1/30]
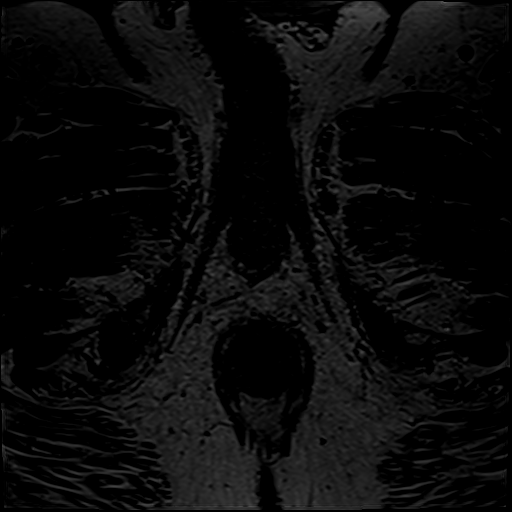
[im 30/30]
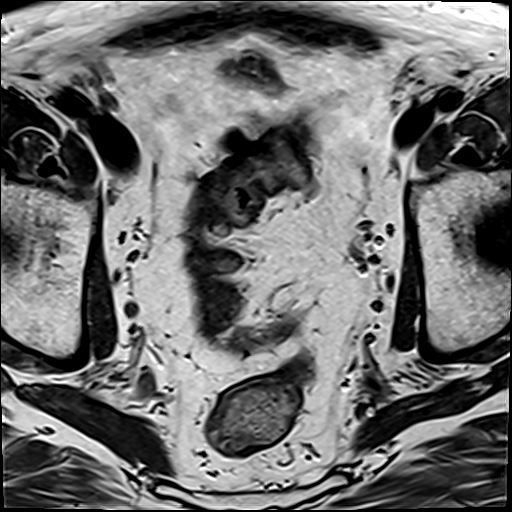

[Series 13: T1 · axial · 3.0mm · 1.15mm/px · z∈[-69,+12]mm · 2 of 28 slices shown (2 of 17)]
[im 1/28]
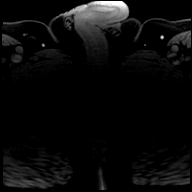
[im 28/28]
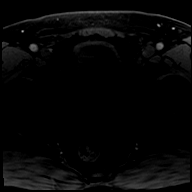

[Series 15: T1 · axial · 3.0mm · 1.15mm/px · z∈[-69,+12]mm · 2 of 28 slices shown (3 of 17)]
[im 1/28]
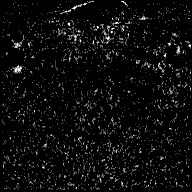
[im 28/28]
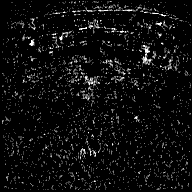

[Series 18: T1 · axial · 3.0mm · 1.15mm/px · z∈[-69,+12]mm · 2 of 28 slices shown (4 of 17)]
[im 1/28]
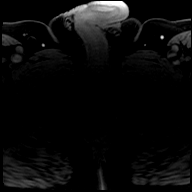
[im 28/28]
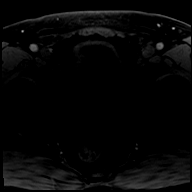

[Series 25: T1 · axial · 3.0mm · 1.15mm/px · z∈[-69,+12]mm · 2 of 28 slices shown (5 of 17)]
[im 1/28]
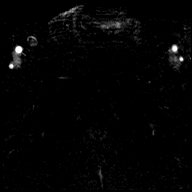
[im 28/28]
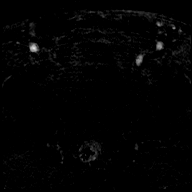

[Series 29: T1 · axial · 3.0mm · 1.15mm/px · z∈[-69,+12]mm · 3 of 28 slices shown (6 of 17)]
[im 1/28]
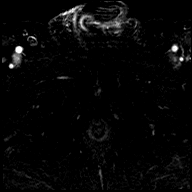
[im 14/28]
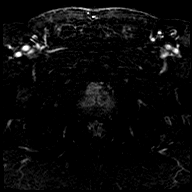
[im 28/28]
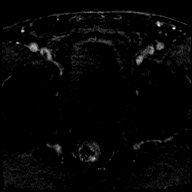

[Series 32: T1 · axial · 3.0mm · 1.15mm/px · z∈[-69,+12]mm · 3 of 28 slices shown (7 of 17)]
[im 1/28]
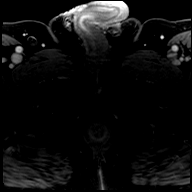
[im 14/28]
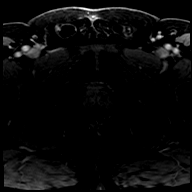
[im 28/28]
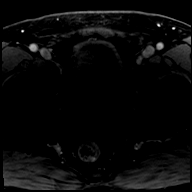

[Series 36: T1 · axial · 3.0mm · 1.15mm/px · z∈[-69,+12]mm · 3 of 28 slices shown (8 of 17)]
[im 1/28]
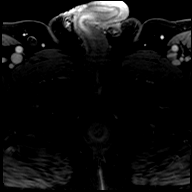
[im 14/28]
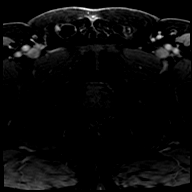
[im 28/28]
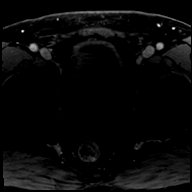

[Series 37: T1 · axial · 3.0mm · 1.15mm/px · z∈[-69,+12]mm · 3 of 28 slices shown (9 of 17)]
[im 1/28]
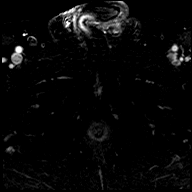
[im 14/28]
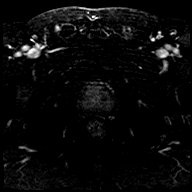
[im 28/28]
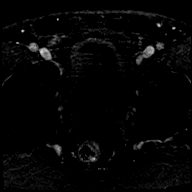

[Series 40: T1 · axial · 3.0mm · 1.15mm/px · z∈[-69,+12]mm · 3 of 28 slices shown (10 of 17)]
[im 1/28]
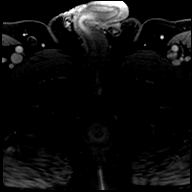
[im 14/28]
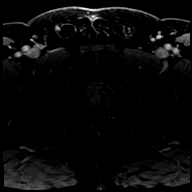
[im 28/28]
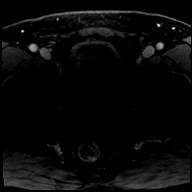

[Series 42: T1 · axial · 3.0mm · 1.15mm/px · z∈[-69,+12]mm · 3 of 28 slices shown (11 of 17)]
[im 1/28]
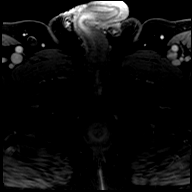
[im 14/28]
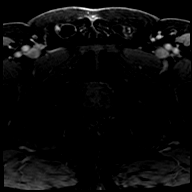
[im 28/28]
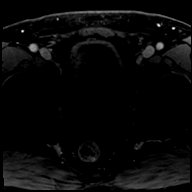

[Series 46: T1 · axial · 3.0mm · 1.15mm/px · z∈[-69,+12]mm · 3 of 28 slices shown (12 of 17)]
[im 1/28]
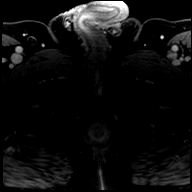
[im 14/28]
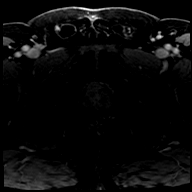
[im 28/28]
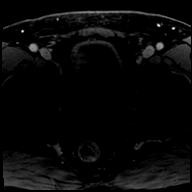

[Series 47: T1 · axial · 3.0mm · 1.15mm/px · z∈[-69,+12]mm · 3 of 28 slices shown (13 of 17)]
[im 1/28]
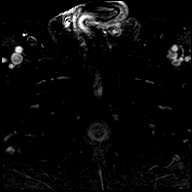
[im 14/28]
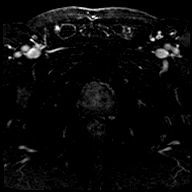
[im 28/28]
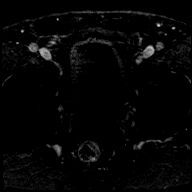

[Series 54: T1 · axial · 3.0mm · 1.15mm/px · z∈[-69,+12]mm · 3 of 28 slices shown (14 of 17)]
[im 1/28]
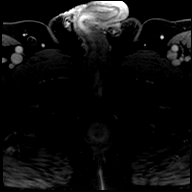
[im 14/28]
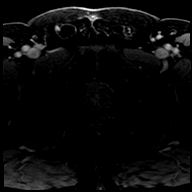
[im 28/28]
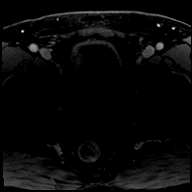

[Series 55: T1 · axial · 3.0mm · 1.15mm/px · z∈[-69,+12]mm · 3 of 28 slices shown (15 of 17)]
[im 1/28]
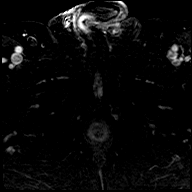
[im 14/28]
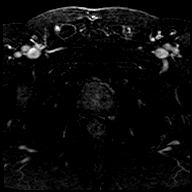
[im 28/28]
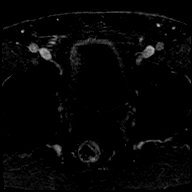

[Series 60: T1 · axial · 3.0mm · 1.15mm/px · z∈[-69,+12]mm · 3 of 28 slices shown (16 of 17)]
[im 1/28]
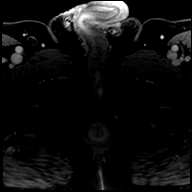
[im 14/28]
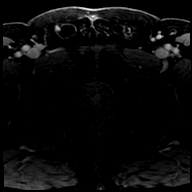
[im 28/28]
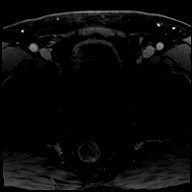

[Series 61: T1 · axial · 3.0mm · 1.15mm/px · z∈[-69,+12]mm · 3 of 28 slices shown (17 of 17)]
[im 1/28]
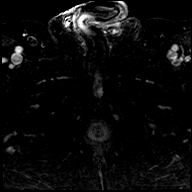
[im 14/28]
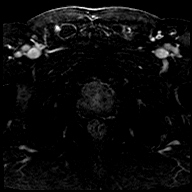
[im 28/28]
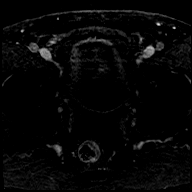

[48 of 48 positions shown; findings below may reference images not displayed]

FINDINGS: Prostate: Demonstrates mild central gland enlargement and
heterogeneity, consistent with benign prostatic hyperplasia. No
suspicious peripheral zone finding.

The most suspicious area is within the anterior right central gland,
mid gland level. This measures on the order of 1.7 x 1.0 cm on [DATE].
Moderately hypointense with ill-defined margins. Corresponds to
restricted diffusion on [DATE]. PI-RADS(v2.1)-5.

Within the anterior left central gland, base level, a 1.0 cm
moderately hypointense nodule has partially obscured and partially
well-defined margins, including on [DATE]. Corresponds to
significantly restricted diffusion on [DATE]. PI-RADS(v2.1)-3.

Volume: 4.3 x 3.7 x 4.6 cm (volume = 38 cm^3)

Transcapsular spread: No gross trans capsular spread. There is mild
bulging of the capsule at the level of the anterior right central
gland lesion including on [DATE].

Seminal vesicle involvement: Absent

Neurovascular bundle involvement: Absent

Pelvic adenopathy: Absent

Bone metastasis: Absent

Other findings: No significant free fluid. Normal urinary bladder. A
tiny fat containing right inguinal hernia.
IMPRESSION: 1. Anterior right mid gland central nodule with ill-defined borders.
PI-RADS(v2.1)-5. Capsular bulging in this region without gross trans
capsular spread.
2. Left mid gland, central nodule with partially obscured margins.
PI-RADS(v2.1)-3.

## 2021-11-13 ENCOUNTER — Other Ambulatory Visit: Payer: Self-pay

## 2021-11-13 ENCOUNTER — Encounter: Payer: Self-pay | Admitting: Dermatology

## 2021-11-13 ENCOUNTER — Ambulatory Visit: Payer: Medicare Other | Admitting: Dermatology

## 2021-11-13 DIAGNOSIS — L570 Actinic keratosis: Secondary | ICD-10-CM

## 2021-11-13 DIAGNOSIS — L578 Other skin changes due to chronic exposure to nonionizing radiation: Secondary | ICD-10-CM | POA: Diagnosis not present

## 2021-11-13 DIAGNOSIS — L821 Other seborrheic keratosis: Secondary | ICD-10-CM

## 2021-11-13 DIAGNOSIS — D485 Neoplasm of uncertain behavior of skin: Secondary | ICD-10-CM

## 2021-11-13 DIAGNOSIS — Z872 Personal history of diseases of the skin and subcutaneous tissue: Secondary | ICD-10-CM | POA: Diagnosis not present

## 2021-11-13 NOTE — Patient Instructions (Addendum)
Wound Care Instructions  Cleanse wound gently with soap and water once a day then pat dry with clean gauze. Apply a thing coat of Petrolatum (petroleum jelly, "Vaseline") over the wound (unless you have an allergy to this). We recommend that you use a new, sterile tube of Vaseline. Do not pick or remove scabs. Do not remove the yellow or white "healing tissue" from the base of the wound.  Cover the wound with fresh, clean, nonstick gauze and secure with paper tape. You may use Band-Aids in place of gauze and tape if the would is small enough, but would recommend trimming much of the tape off as there is often too much. Sometimes Band-Aids can irritate the skin.  You should call the office for your biopsy report after 1 week if you have not already been contacted.  If you experience any problems, such as abnormal amounts of bleeding, swelling, significant bruising, significant pain, or evidence of infection, please call the office immediately.  Recommend daily broad spectrum sunscreen SPF 30+ to sun-exposed areas, reapply every 2 hours as needed. Call for new or changing lesions.  Staying in the shade or wearing long sleeves, sun glasses (UVA+UVB protection) and wide brim hats (4-inch brim around the entire circumference of the hat) are also recommended for sun protection.    If You Need Anything After Your Visit  If you have any questions or concerns for your doctor, please call our main line at [phone] and press option 4 to reach your doctor's medical assistant. If no one answers, please leave a voicemail as directed and we will return your call as soon as possible. Messages left after 4 pm will be answered the following business day.   You may also send [place] a message via [place]. We typically respond to MyChart messages within 1-2 business days.  For prescription refills, please ask your pharmacy to contact our office. Our fax number is [phone].  If you have an urgent issue when the  clinic is closed that cannot wait until the next business day, you can page your doctor at the number below.    Please note that while we do our best to be available for urgent issues outside of office hours, we are not available 24/7.   If you have an urgent issue and are unable to reach [place], you may choose to seek medical care at your doctor's office, retail clinic, urgent care center, or emergency room.  If you have a medical emergency, please immediately call 911 or go to the emergency department.  Pager Numbers  - Dr. Nehemiah Massed: [phone]  - Dr. Laurence Ferrari: [phone]  - Dr. Nicole Kindred: [phone]  In the event of inclement weather, please call our main line at [phone] for an update on the status of any delays or closures.  Dermatology Medication Tips: Please keep the boxes that topical medications come in in order to help keep track of the instructions about where and how to use these. Pharmacies typically print the medication instructions only on the boxes and not directly on the medication tubes.   If your medication is too expensive, please contact our office at [phone] option 4 or send [place] a message through [place].   We are unable to tell what your co-pay for medications will be in advance as this is different depending on your insurance coverage. However, we may be able to find a substitute medication at lower cost or fill out paperwork to get insurance to cover a needed medication.  If a prior authorization is required to get your medication covered by your insurance company, please allow [place] 1-2 business days to complete this process.  Drug prices often vary depending on where the prescription is filled and some pharmacies may offer cheaper prices.  The website www.goodrx.com contains coupons for medications through different pharmacies. The prices here do not account for what the cost may be with help from insurance (it may be cheaper with your insurance), but the  website can give you the price if you did not use any insurance.  - You can print the associated coupon and take it with your prescription to the pharmacy.  - You may also stop by our office during regular business hours and pick up a GoodRx coupon card.  - If you need your prescription sent electronically to a different pharmacy, notify our office through [facility] or by phone at [phone] option 4.     Si Usted Necesita Algo Despus de Su Visita  Tambin puede enviarnos un mensaje a travs de Pharmacist, community. Por lo general respondemos a los mensajes de MyChart en el transcurso de 1 a 2 das hbiles.  Para renovar recetas, por favor pida a su farmacia que se ponga en contacto con nuestra oficina. Harland Dingwall de fax es [place] [phone].  Si tiene un asunto urgente cuando la clnica est cerrada y que no puede esperar hasta el siguiente da hbil, puede llamar/localizar a su doctor(a) al nmero que aparece a continuacin.   Por favor, tenga en cuenta que aunque hacemos todo lo posible para estar disponibles para asuntos urgentes fuera del horario de [place], no estamos disponibles las 24 horas del da, los 7 das de la [place].   Si tiene un problema urgente y no puede comunicarse con nosotros, puede optar por buscar atencin mdica  en el consultorio de su doctor(a), en una clnica privada, en un centro de atencin urgente o en una sala de emergencias.  Si tiene Engineering geologist, por favor llame inmediatamente al 911 o vaya a la sala de emergencias.  Nmeros de bper  - Dr. Nehemiah Massed: [phone]  - Dra. Moye: [phone]  - Dra. Nicole Kindred: [phone]  En caso de inclemencias del [place], por favor llame a Johnsie Kindred principal al [phone] para una actualizacin sobre el [place] de cualquier retraso o cierre.  Consejos para la medicacin en dermatologa: Por favor, guarde las cajas en las que vienen los medicamentos de uso tpico para ayudarle a seguir las  instrucciones sobre dnde y cmo usarlos. Las farmacias generalmente imprimen las instrucciones del medicamento slo en las cajas y no directamente en los tubos del [place].   Si su medicamento es muy caro, por favor, pngase en contacto con Zigmund Daniel llamando al [phone] y presione la opcin 4 o envenos un mensaje a travs de Pharmacist, community.   No podemos decirle cul ser su copago por los medicamentos por adelantado ya que esto es diferente dependiendo de la cobertura de su seguro. Sin embargo, es posible que podamos encontrar un medicamento sustituto a Electrical engineer un formulario para que el seguro cubra el medicamento que se considera necesario.   Si se requiere una autorizacin previa para que su compaa de seguros [place] su medicamento, por favor permtanos de 1 a 2 das hbiles para completar este proceso.  Los precios de los medicamentos varan con frecuencia dependiendo del Environmental consultant de dnde se surte la receta y alguna farmacias pueden ofrecer precios ms baratos.  El sitio web www.goodrx.com  tiene cupones para medicamentos de Airline pilot. Los precios aqu no tienen en cuenta lo que podra costar con la ayuda del seguro (puede ser ms barato con su seguro), pero el sitio web puede darle el precio si no utiliz Research scientist (physical sciences).  - Puede imprimir el cupn correspondiente y llevarlo con su receta a la farmacia.  - Tambin puede pasar por nuestra oficina durante el horario de atencin regular y Charity fundraiser una tarjeta de cupones de GoodRx.  - Si necesita que su receta se enve electrnicamente a una farmacia diferente, informe a nuestra oficina a travs de MyChart de [place] o por telfono llamando al [phone] y presione la opcin 4.

## 2021-11-13 NOTE — Progress Notes (Signed)
° °  Follow-Up Visit   Subjective  Bobby Nichols is a 81 y.o. male who presents for the following: Follow-up (Patient here today for 4 month AK follow up. Patient does advise he has a spot at right nose that is sometimes sore, present for 1-2 months. ).  AK's treated with LN2 at last visit at Left Zygoma x 1, right temple x 1, right helix x 1, right lateral zygoma x 1  The following portions of the chart were reviewed this encounter and updated as appropriate:   Tobacco   Allergies   Meds   Problems   Med Hx   Surg Hx   Fam Hx       Review of Systems:  No other skin or systemic complaints except as noted in HPI or Assessment and Plan.  Objective  Well appearing patient in no apparent distress; mood and affect are within normal limits.  A focused examination was performed including face, ears, neck. Relevant physical exam findings are noted in the Assessment and Plan.  right medial cheek 0.45 cm pink papule       Assessment & Plan  Neoplasm of uncertain behavior of skin right medial cheek  Skin / nail biopsy Type of biopsy: tangential   Informed consent: discussed and consent obtained   Timeout: patient name, date of birth, surgical site, and procedure verified   Patient was prepped and draped in usual sterile fashion: Area prepped with isopropyl alcohol. Anesthesia: the lesion was anesthetized in a standard fashion   Anesthetic:  1% lidocaine w/ epinephrine 1-100,000 buffered w/ 8.4% NaHCO3 Instrument used: DermaBlade   Hemostasis achieved with: aluminum chloride   Outcome: patient tolerated procedure well   Post-procedure details: wound care instructions given   Additional details:  Mupirocin and a bandage applied  Specimen 1 - Surgical pathology Differential Diagnosis: r/o BCC  Check Margins: No 0.45 cm pink papule  If + for [facility] will plan Mohs   History of PreCancerous Actinic Keratosis  - site(s) of PreCancerous Actinic Keratosis clear today. - these may recur  and new lesions may form requiring treatment to prevent transformation into skin cancer - observe for new or changing spots and contact [place] for appointment if occur - photoprotection with sun protective clothing; sunglasses and broad spectrum sunscreen with SPF of at least 30 + and frequent self skin exams recommended - yearly exams by a dermatologist recommended for persons with history of PreCancerous Actinic Keratoses  Actinic Damage - chronic, secondary to cumulative UV radiation exposure/sun exposure over time - diffuse scaly erythematous macules with underlying dyspigmentation - Recommend daily broad spectrum sunscreen SPF 30+ to sun-exposed areas, reapply every 2 hours as needed.  - Recommend staying in the shade or wearing long sleeves, sun glasses (UVA+UVB protection) and wide brim hats (4-inch brim around the entire circumference of the hat). - Call for new or changing lesions.  Seborrheic Keratoses - Stuck-on, waxy, tan-brown papules and/or plaques  - Benign-appearing - Discussed benign etiology and prognosis. - Observe - Call for any changes   Return in about 6 months (around 05/13/2022) for TBSE.  Graciella Belton, RMA, am acting as scribe for Forest Gleason, MD .  Documentation: I have reviewed the above documentation for accuracy and completeness, and I agree with the above.  Forest Gleason, MD

## 2021-11-16 ENCOUNTER — Other Ambulatory Visit: Payer: Self-pay | Admitting: Dermatology

## 2021-11-19 ENCOUNTER — Telehealth: Payer: Self-pay

## 2021-11-19 NOTE — Telephone Encounter (Signed)
Patient advised of BX results and scheduled with Dr. Nicole Kindred to have that place treated. aw

## 2021-11-19 NOTE — Telephone Encounter (Addendum)
Tried calling patient regarding biopsy results and treatment option. No answer. Left message for patient to call office.     ----- Message from Alfonso Patten, MD sent at 11/18/2021 11:08 PM EST ----- Skin , right medial cheek LICHENOID ACTINIC KERATOSIS, BASE INVOLVED ---> LN2 in clinic within next 4 months  MAs please call. Thank you!

## 2021-12-09 ENCOUNTER — Other Ambulatory Visit: Payer: Self-pay

## 2021-12-09 ENCOUNTER — Ambulatory Visit: Payer: Medicare Other | Admitting: Urology

## 2021-12-09 ENCOUNTER — Encounter: Payer: Self-pay | Admitting: Urology

## 2021-12-09 VITALS — BP 154/80 | HR 75 | Ht 70.0 in | Wt 192.0 lb

## 2021-12-09 DIAGNOSIS — C61 Malignant neoplasm of prostate: Secondary | ICD-10-CM | POA: Diagnosis not present

## 2021-12-09 NOTE — Progress Notes (Signed)
05/10/2020 1:52 PM   Bobby Nichols 22-Sep-1941 [id number]  Chief complaint: Follow-up  Urological history: 1.  T1c prostate cancer -Benign biopsy 12/2012 for PSA in the 9-10 range -MRI 2017 for PSA 13.8 showing PI-RADS 3 and 4 lesions -Fusion biopsy showed multifocal high-grade PIN and a focus of atypia suspicious for adenocarcinoma.  PSA was 13.  Surveillance elected -Repeat MRI 12/20, 38 cc gland, PI-RADS 5 lesion right mid gland, PI-RADS 3 lesion left mid gland -s/p MR fusion biopsy at [facility] 11/07/2019 with no post biopsy complaints.  -Prostate volume by ultrasound was 52.1 g.  4 cores each of ROI lesions were obtained along with 12 core standard biopsies. -PSA was 15.1  Pathology: -1/2 cores positive Gleason 3+4 adenocarcinoma left ROI (20%) -1/2 cores positive Gleason 3+3-second left ROI sample (20%) -14 cores with atypia right ROI -12 core biopsy RML core positive Gleason 3+3 (10%) -LB and RB cores with high-grade PIN    2.  Hypogonadism  HPI: Bobby Nichols is a 81 y.o. male with prostate cancer s/p MR fusion biopsy at [facility] 11/07/2019. Patient returns today for a follow-up visit.  He was last seen 05/10/2020.  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 If you have any questions, ask your nurse or doctor.          amLODipine 5 MG tablet Commonly known as: NORVASC Take by mouth.   aspirin EC 81 MG tablet Take 81 mg by mouth daily.   atorvastatin 20 MG tablet Commonly known as: LIPITOR TK 1 T PO  QHS   fluocinonide 0.05 % external solution Commonly known as: LIDEX APP ON THE SKIN BID PRN   ketoconazole 2 % cream Commonly known as: NIZORAL APPLY TO FACE BID PRN   ketoconazole 2 % shampoo Commonly known as: NIZORAL SHAMPOO 3 TIMES A WEEK, LATHER ON SCALP, LEAVE ON 8 TO 10 MINUTES, RINSE WELL   ketoconazole 2 % cream Commonly known as: NIZORAL APPLY TOPICALLY TO FACE TWICE DAILY AS NEEDED   multivitamin tablet Take 1 tablet by mouth daily.   OMEGA-3 FATTY ACIDS PO Take 500 mg by mouth.   omeprazole 20 MG capsule Commonly known as: PRILOSEC TK 1 C PO QD   PreviDent 5000 Booster Plus 1.1 % Pste Generic drug: Sodium Fluoride   propranolol 40 MG tablet Commonly known as: INDERAL Take by mouth.   triamcinolone cream 0.1 % Commonly known as: KENALOG Apply 1 application topically 2 (two) times daily as needed (Rash). For itchy areas on ears and eyebrows use for up to 2 weeks. Avoid applying to face, groin, and axilla. Use as directed.  Allergies: No Known Allergies  Family History: Family History  Problem Relation Age of Onset   Prostate cancer Neg Hx    Bladder Cancer Neg Hx    Kidney cancer Neg Hx     Social History:  reports that he has never smoked. He has never used smokeless tobacco. He reports that he does not drink alcohol and does not use drugs.   Physical Exam: BP (!) 154/80    Pulse 75    Ht 5\' 10"  (1.778 m)    Wt 192 lb (87.1 kg)    BMI 27.55 kg/m   Constitutional:  Alert and oriented, No acute distress. HEENT: [place] AT, moist mucus membranes.  Trachea midline, no masses. Cardiovascular: No clubbing, cyanosis, or edema. Respiratory: Normal  respiratory effort, no increased work of breathing. Skin: No rashes, bruises or suspicious lesions. Neurologic: Grossly intact, no focal deficits, moving all 4 extremities. Psychiatric: Normal mood and affect.   Assessment & Plan:    1. T1c adenocarcinoma prostate Initially elected active surveillance Last PSA July 2021 and PSA drawn today If no significant change he desires to continue active surveillance.  He will be notified with results and further recommendations 88-month lab visit for PSA and 1 year office visit or schedule   [facility] [address] [place], [place] [address] [phone]    Abbie Sons, MD

## 2021-12-10 ENCOUNTER — Telehealth: Payer: Self-pay | Admitting: *Deleted

## 2021-12-10 ENCOUNTER — Other Ambulatory Visit: Payer: Self-pay | Admitting: Urology

## 2021-12-10 DIAGNOSIS — C61 Malignant neoplasm of prostate: Secondary | ICD-10-CM

## 2021-12-10 LAB — PSA: Prostate Specific Ag, Serum: 20.4 ng/mL — ABNORMAL HIGH (ref 0.0–4.0)

## 2021-12-10 NOTE — Telephone Encounter (Signed)
-----   Message from Abbie Sons, MD sent at 12/10/2021  2:22 PM EST ----- PSA has significantly increased at 20.4.  Recommend scheduling repeat MRI.  Order was entered and will call with results

## 2021-12-10 NOTE — Telephone Encounter (Signed)
Notified patient as instructed,.  

## 2021-12-24 ENCOUNTER — Other Ambulatory Visit: Payer: Self-pay

## 2021-12-24 ENCOUNTER — Ambulatory Visit
Admission: RE | Admit: 2021-12-24 | Discharge: 2021-12-24 | Disposition: A | Discharge status: Home / Self Care | Payer: Medicare Other | Source: Ambulatory Visit | Attending: Urology | Admitting: Urology

## 2021-12-24 DIAGNOSIS — C61 Malignant neoplasm of prostate: Secondary | ICD-10-CM | POA: Insufficient documentation

## 2021-12-24 MED ORDER — GADOBUTROL 1 MMOL/ML IV SOLN
9.0000 mL | Freq: Once | INTRAVENOUS | Status: AC | PRN
  Administered 2021-12-24: 9 mL via INTRAVENOUS

## 2022-01-01 ENCOUNTER — Other Ambulatory Visit: Payer: Self-pay

## 2022-01-01 ENCOUNTER — Encounter: Payer: Self-pay | Admitting: Urology

## 2022-01-01 ENCOUNTER — Ambulatory Visit: Payer: Medicare Other | Admitting: Urology

## 2022-01-01 VITALS — BP 129/82 | HR 75 | Ht 70.0 in | Wt 191.0 lb

## 2022-01-01 DIAGNOSIS — C61 Malignant neoplasm of prostate: Secondary | ICD-10-CM | POA: Diagnosis not present

## 2022-01-01 NOTE — Progress Notes (Signed)
? ?05/10/2020 ?9:11 PM  ? ?Bobby Nichols ?04-Apr-1941 ?[id number] ? ?Chief complaint: Follow-up ? ?Urological history: ?1.  T1c prostate cancer ?-Benign biopsy 12/2012 for PSA in the 9-10 range ?-MRI 2017 for PSA 13.8 showing PI-RADS 3 and 4 lesions ?-Fusion biopsy showed multifocal high-grade PIN and a focus of atypia suspicious for adenocarcinoma.  PSA was 13.  Surveillance elected ?-Repeat MRI 12/20, 38 cc gland, PI-RADS 5 lesion right mid gland, PI-RADS 3 lesion left mid gland ?-s/p MR fusion biopsy at [facility] 11/07/2019 with no post biopsy complaints.  ?-Prostate volume by ultrasound was 52.1 g.  4 cores each of ROI lesions were obtained along with 12 core standard biopsies. ?-PSA was 15.1  ?Pathology: ?-1/2 cores positive Gleason 3+4 adenocarcinoma left ROI (20%) ?-1/2 cores positive Gleason 3+3-second left ROI sample (20%) ?-14 cores with atypia right ROI ?-12 core biopsy RML core positive Gleason 3+3 (10%) ?-LB and RB cores with high-grade PIN ?  ? 2.  Hypogonadism ? ?HPI: ?Bobby Nichols is a 81 y.o. male with prostate cancer s/p MR fusion biopsy at [facility] 11/07/2019. Patient returns today for a follow-up visit.  He was last seen 05/10/2020. ? ?Seen last month and prostate MRI was ordered for a PSA bump to 20.4 ?The previously noted PI-RADS 5 lesion in the anterior transition zone and anterior fibromuscular stroma was similar to prior MRI and was suspicious for transcapsular spread but the most recent scan suspicious for transcapsular spread ?Prostate volume 44 cc ? ?PSA trend:  ?09/23/2016: 13.8 ?09/17/2017: 10.8 ?03/16/2018: 10.6 ?12/21/2018: 12.9 ?05/07/2020: 15.1 ?10/23/2020: 11.7 ?12/09/2021: 20.4 ? ? ?PMH: ?Past Medical History:  ?Diagnosis Date  ? Basal cell carcinoma (BCC) of back 11/17/2019  ? Left upper back  ? 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 [facility] 11/16/2018  ? Basal cell carcinoma (BCC) of right side of neck 10/28/2018  ? [facility] 11/16/2018  ? Diabetes mellitus without  complication ([place])   ? Hyperlipidemia   ? Hypertension   ? ? ?Surgical History: ?Past Surgical History:  ?Procedure Laterality Date  ? HERNIA REPAIR    ? ? ?Home Medications:  ?Allergies as of 01/01/2022   ?No Known Allergies ?  ? ?  ?Medication List  ?  ? ?  ? Accurate as of January 01, 2022  9:11 PM. If you have any questions, ask your nurse or doctor.  ?  ?  ? ?  ? ?amLODipine 5 MG tablet ?Commonly known as: NORVASC ?Take by mouth. ?  ?aspirin EC 81 MG tablet ?Take 81 mg by mouth daily. ?  ?atorvastatin 20 MG tablet ?Commonly known as: LIPITOR ?TK 1 T PO  QHS ?  ?fluocinonide 0.05 % external solution ?Commonly known as: LIDEX ?APP ON THE SKIN BID PRN ?  ?ketoconazole 2 % cream ?Commonly known as: NIZORAL ?APPLY TO FACE BID PRN ?  ?ketoconazole 2 % shampoo ?Commonly known as: NIZORAL ?SHAMPOO 3 TIMES A WEEK, LATHER ON SCALP, LEAVE ON 8 TO 10 MINUTES, RINSE WELL ?  ?ketoconazole 2 % cream ?Commonly known as: NIZORAL ?APPLY TOPICALLY TO FACE TWICE DAILY AS NEEDED ?  ?multivitamin tablet ?Take 1 tablet by mouth daily. ?  ?OMEGA-3 FATTY ACIDS PO ?Take 500 mg by mouth. ?  ?omeprazole 20 MG capsule ?Commonly known as: PRILOSEC ?TK 1 C PO QD ?  ?PreviDent 5000 Booster Plus 1.1 % Pste ?Generic drug: Sodium Fluoride ?  ?propranolol 40 MG tablet ?Commonly known as: INDERAL ?Take by mouth. ?  ?triamcinolone cream 0.1 % ?  Commonly known as: KENALOG ?Apply 1 application topically 2 (two) times daily as needed (Rash). For itchy areas on ears and eyebrows use for up to 2 weeks. Avoid applying to face, groin, and axilla. Use as directed. ?  ? ?  ? ? ?Allergies: No Known Allergies ? ?Family History: ?Family History  ?Problem Relation Age of Onset  ? Prostate cancer Neg Hx   ? Bladder Cancer Neg Hx   ? Kidney cancer Neg Hx   ? ? ?Social History:  reports that he has never smoked. He has never used smokeless tobacco. He reports that he does not drink alcohol and does not use drugs. ? ? ?Physical Exam: ?BP 129/82   Pulse 75   Ht '5\' 10"'$   (1.778 m)   Wt 191 lb (86.6 kg)   BMI 27.41 kg/m?   ?Constitutional:  Alert and oriented, No acute distress. ?HEENT: [place] AT, moist mucus membranes.  Trachea midline, no masses. ?Cardiovascular: No clubbing, cyanosis, or edema. ?Respiratory: Normal respiratory effort, no increased work of breathing. ?Skin: No rashes, bruises or suspicious lesions. ?Neurologic: Grossly intact, no focal deficits, moving all 4 extremities. ?Psychiatric: Normal mood and affect. ? ? ?Assessment & Plan:   ? ?1. T1c adenocarcinoma prostate ?Initially elected active surveillance ?Significant PSA bump with recent MRI suspicious for transcapsular spread. 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 ? ?[facility] ?[address], [address] ?[place], [place] [address] ?[phone] ? ? ?Abbie Sons, MD ? ? ?

## 2022-01-07 ENCOUNTER — Ambulatory Visit
Admission: RE | Admit: 2022-01-07 | Discharge: 2022-01-07 | Disposition: A | Discharge status: Home / Self Care | Payer: Medicare Other | Source: Ambulatory Visit | Attending: Radiation Oncology | Admitting: Radiation Oncology

## 2022-01-07 ENCOUNTER — Other Ambulatory Visit: Payer: Self-pay

## 2022-01-07 ENCOUNTER — Encounter: Payer: Self-pay | Admitting: Radiation Oncology

## 2022-01-07 ENCOUNTER — Other Ambulatory Visit: Payer: Self-pay | Admitting: *Deleted

## 2022-01-07 DIAGNOSIS — E785 Hyperlipidemia, unspecified: Secondary | ICD-10-CM | POA: Insufficient documentation

## 2022-01-07 DIAGNOSIS — C61 Malignant neoplasm of prostate: Secondary | ICD-10-CM

## 2022-01-07 DIAGNOSIS — I10 Essential (primary) hypertension: Secondary | ICD-10-CM | POA: Diagnosis not present

## 2022-01-07 DIAGNOSIS — Z85828 Personal history of other malignant neoplasm of skin: Secondary | ICD-10-CM | POA: Diagnosis not present

## 2022-01-07 DIAGNOSIS — Z79899 Other long term (current) drug therapy: Secondary | ICD-10-CM | POA: Diagnosis not present

## 2022-01-07 DIAGNOSIS — E119 Type 2 diabetes mellitus without complications: Secondary | ICD-10-CM | POA: Diagnosis not present

## 2022-01-07 DIAGNOSIS — Z7982 Long term (current) use of aspirin: Secondary | ICD-10-CM | POA: Insufficient documentation

## 2022-01-07 HISTORY — DX: Malignant neoplasm of prostate: C61

## 2022-01-07 NOTE — Consult Note (Signed)
?NEW PATIENT EVALUATION ? ?Name: Bobby Nichols  ?MRN: [id number]  ?Date:   01/07/2022     ?DOB: 03-Apr-1941 ? ? ?This 81 y.o. male patient presents to the clinic for initial evaluation of stage IIb (cT2 aN0 M0) Gleason 7 (3+4) adenocarcinoma the prostate presenting with a PSA in the 15 range. ? ?REFERRING PHYSICIAN: Baxter Hire, MD ? ?CHIEF COMPLAINT:  ?Chief Complaint  ?Patient presents with  ? Prostate Cancer  ?  Initial consultation  ? ? ?DIAGNOSIS: The encounter diagnosis was Prostate cancer ([place]). ?  ?PREVIOUS INVESTIGATIONS:  ?MRI reviewed bone scan ordered ?Clinical notes reviewed ?Pathology reports reviewed ? ?HPI: Patient is a 81 year old male who has been followed for several years on active surveillance dating back to 2017.  He had an PSA of 13.8 back in 2017 and biopsy fusion study showed a focus of atypia suspicious for adenocarcinoma.  Back in 1220 had a repeat MRI scan showing a PI-RADS 5 lesion in the right mid gland.  Fusion biopsy at that time showed a 52 g prostate with 4 cores positive for adenocarcinoma highest grade being a Gleason 7 (3+4).  His most recent PSA has jumped to 20.4 and at this time he is referred to radiation oncology for opinion.  He is fairly asymptomatic specifically denies any increased lower Neri tract symptoms such as frequency urgency or nocturia.  His most recent MRI scan performed this month of his prostate showed similar appearance with 2 lesions in the prostate gland PI-RADS category 5 lesions in the right anterior transitional zone with associated capsular bulging.  There was no specific gross trans capsular spread.  There was also no seminal vesicle involvement pelvic adenopathy or bone metastasis noted. ?PLANNED TREATMENT REGIMEN: Image guided IMRT radiation therapy plus ADT therapy ? ?PAST MEDICAL HISTORY:  has a past medical history of Basal cell carcinoma (BCC) of back (11/17/2019), Basal cell carcinoma (BCC) of left shoulder (10/28/2018), Basal cell carcinoma  (BCC) of right side of neck (10/28/2018), Diabetes mellitus without complication ([place]), Hyperlipidemia, Hypertension, and Prostate cancer ([place]).   ? ?PAST SURGICAL HISTORY:  ?Past Surgical History:  ?Procedure Laterality Date  ? HERNIA REPAIR    ? ? ?FAMILY HISTORY: family history is not on file. ? ?SOCIAL HISTORY:  reports that he has never smoked. He has never used smokeless tobacco. He reports that he does not drink alcohol and does not use drugs. ? ?ALLERGIES: Patient has no known allergies. ? ?MEDICATIONS:  ?Current Outpatient Medications  ?Medication Sig Dispense Refill  ? amLODipine (NORVASC) 5 MG tablet Take by mouth.    ? aspirin EC 81 MG tablet Take 81 mg by mouth daily.    ? atorvastatin (LIPITOR) 20 MG tablet TK 1 T PO  QHS  1  ? fluocinonide (LIDEX) 0.05 % external solution APP ON THE SKIN BID PRN  2  ? ketoconazole (NIZORAL) 2 % cream APPLY TO FACE BID PRN  1  ? ketoconazole (NIZORAL) 2 % cream APPLY TOPICALLY TO FACE TWICE DAILY AS NEEDED 30 g 1  ? ketoconazole (NIZORAL) 2 % shampoo SHAMPOO 3 TIMES A WEEK, LATHER ON SCALP, LEAVE ON 8 TO 10 MINUTES, RINSE WELL 120 mL 11  ? Multiple Vitamin (MULTIVITAMIN) tablet Take 1 tablet by mouth daily.    ? OMEGA-3 FATTY ACIDS PO Take 500 mg by mouth.    ? omeprazole (PRILOSEC) 20 MG capsule TK 1 C PO QD  0  ? PREVIDENT 5000 BOOSTER PLUS 1.1 % PSTE   12  ?  propranolol (INDERAL) 40 MG tablet Take by mouth.    ? triamcinolone (KENALOG) 0.1 % Apply 1 application topically 2 (two) times daily as needed (Rash). For itchy areas on ears and eyebrows use for up to 2 weeks. Avoid applying to face, groin, and axilla. Use as directed. 80 g 2  ? ?No current facility-administered medications for this encounter.  ? ? ?ECOG PERFORMANCE STATUS:  0 - Asymptomatic ? ?REVIEW OF SYSTEMS: ?Patient denies any weight loss, fatigue, weakness, fever, chills or night sweats. Patient denies any loss of vision, blurred vision. Patient denies any ringing  of the ears or hearing loss. No  irregular heartbeat. Patient denies heart murmur or history of fainting. Patient denies any chest pain or pain radiating to her upper extremities. Patient denies any shortness of breath, difficulty breathing at night, cough or hemoptysis. Patient denies any swelling in the lower legs. Patient denies any nausea vomiting, vomiting of blood, or coffee ground material in the vomitus. Patient denies any stomach pain. Patient states has had normal bowel movements no significant constipation or diarrhea. Patient denies any dysuria, hematuria or significant nocturia. Patient denies any problems walking, swelling in the joints or loss of balance. Patient denies any skin changes, loss of hair or loss of weight. Patient denies any excessive worrying or anxiety or significant depression. Patient denies any problems with insomnia. Patient denies excessive thirst, polyuria, polydipsia. Patient denies any swollen glands, patient denies easy bruising or easy bleeding. Patient denies any recent infections, allergies or URI. Patient "s visual fields have not changed significantly in recent time. ?  ?PHYSICAL EXAM: ?BP (!) (P) 167/90 (BP Location: Left Arm, Patient Position: Sitting)   Pulse (!) (P) 54   Temp (!) (P) 95.7 ?F (35.4 ?C) (Tympanic)   Resp (P) 16   Ht (P) '5\' 10"'$  (1.778 m)   Wt (P) 197 lb 4.8 oz (89.5 kg)   BMI (P) 28.31 kg/m?  ?Well-developed well-nourished patient in NAD. HEENT reveals PERLA, EOMI, discs not visualized.  Oral cavity is clear. No oral mucosal lesions are identified. Neck is clear without evidence of cervical or supraclavicular adenopathy. Lungs are clear to A&P. Cardiac examination is essentially unremarkable with regular rate and rhythm without murmur rub or thrill. Abdomen is benign with no organomegaly or masses noted. Motor sensory and DTR levels are equal and symmetric in the upper and lower extremities. Cranial nerves II through XII are grossly intact. Proprioception is intact. No peripheral  adenopathy or edema is identified. No motor or sensory levels are noted. Crude visual fields are within normal range. ? ?LABORATORY DATA: Pathology reports reviewed ? ?  ?RADIOLOGY RESULTS: MRI scan reviewed bone scan ordered ? ? ?IMPRESSION: Stage IIb adenocarcinoma the prostate in 81 year old male with progressive PSA over 20 ? ?PLAN: At this time of ordered a bone scan to make sure were not dealing with stage IV disease.  I believe he would be a good candidate for image guided IMRT radiation therapy plus ADT therapy.  [facility] lymph node assessment shows only a proximal 11% chance of involvement.  I would concentrate on his prostate and treat 80 Gray over 8 weeks using image guided technique.  Risks and benefits of treatment including increased lower urinary tract symptoms diarrhea fatigue alteration of blood counts and skin reaction all were reviewed with the patient.  I have asked Dr. Bernardo Heater to place fiducial markers in his prostate and also start Eligard for 6 months.  We will not simulate the patient to review  his bone scan should that change his overall diagnosis.  Patient comprehends my recommendations well. ? ?I would like to take this opportunity to thank you for allowing me to participate in the care of your patient.. ? ?Noreene Filbert, MD ? ? ? ? ? ? ? ? ?

## 2022-01-13 ENCOUNTER — Other Ambulatory Visit: Payer: Self-pay

## 2022-01-13 ENCOUNTER — Ambulatory Visit: Payer: Medicare Other | Admitting: Dermatology

## 2022-01-13 DIAGNOSIS — L578 Other skin changes due to chronic exposure to nonionizing radiation: Secondary | ICD-10-CM | POA: Diagnosis not present

## 2022-01-13 DIAGNOSIS — L570 Actinic keratosis: Secondary | ICD-10-CM

## 2022-01-13 NOTE — Patient Instructions (Addendum)

## 2022-01-13 NOTE — Progress Notes (Signed)
? ?  Follow-Up Visit ?  ?Subjective  ?Bobby Nichols is a 81 y.o. 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 ? ? ? ?The following portions of the chart were reviewed this encounter and updated as appropriate:  ?  ?  ? ?Review of Systems:  No other skin or systemic complaints except as noted in HPI or Assessment and Plan. ? ?Objective  ?Well appearing patient in no apparent distress; mood and affect are within normal limits. ? ?A focused examination was performed including face. Relevant physical exam findings are noted in the Assessment and Plan. ? ?right medial cheek ?Light pink macule ? ? ? ?Assessment & Plan  ?AK (actinic keratosis) ?right medial cheek ? 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 ? Actinic keratoses are precancerous spots that appear secondary to cumulative UV radiation exposure/sun exposure over time. They are chronic with expected duration over 1 year. A portion of actinic keratoses will progress to squamous cell carcinoma of the skin. It is not possible to reliably predict which spots will progress to skin cancer and so treatment is recommended to prevent development of skin cancer. ? ?Recommend daily broad spectrum sunscreen SPF 30+ to sun-exposed areas, reapply every 2 hours as needed.  ?Recommend staying in the shade or wearing long sleeves, sun glasses (UVA+UVB protection) and wide brim hats (4-inch brim around the entire circumference of the hat). ?Call for new or changing lesions.  ? ? ?Destruction of lesion - right medial cheek ? ?Destruction method: cryotherapy   ?Informed consent: discussed and consent obtained   ?Timeout:  patient name, date of birth, surgical site, and procedure verified ?Lesion destroyed using liquid nitrogen: Yes   ?Region frozen until ice ball extended beyond lesion: Yes   ?Outcome: patient tolerated procedure well with no complications   ?Post-procedure  details: wound care instructions given   ?Additional details:  Prior to procedure, discussed risks of blister formation, small wound, skin dyspigmentation, or rare scar following cryotherapy. Recommend Vaseline ointment to treated areas while healing.  ? ? ?Actinic Damage ?- chronic, secondary to cumulative UV radiation exposure/sun exposure over time ?- diffuse scaly erythematous macules with underlying dyspigmentation ?- Recommend daily broad spectrum sunscreen SPF 30+ to sun-exposed areas, reapply every 2 hours as needed.  ?- Recommend staying in the shade or wearing long sleeves, sun glasses (UVA+UVB protection) and wide brim hats (4-inch brim around the entire circumference of the hat). ?- Call for new or changing lesions.  ? ?Return for scheduled appt with Dr Laurence Ferrari Sept 21, 2023. ? ?I, Marye Round, CMA, am acting as scribe for Brendolyn Patty, MD .  ? ?Documentation: I have reviewed the above documentation for accuracy and completeness, and I agree with the above. ? ?Brendolyn Patty MD  ?

## 2022-01-17 ENCOUNTER — Ambulatory Visit: Payer: Medicare Other | Admitting: Urology

## 2022-01-17 ENCOUNTER — Encounter: Payer: Self-pay | Admitting: Urology

## 2022-01-17 ENCOUNTER — Telehealth: Payer: Self-pay

## 2022-01-17 VITALS — BP 142/76 | HR 63 | Ht 70.0 in | Wt 191.0 lb

## 2022-01-17 DIAGNOSIS — C61 Malignant neoplasm of prostate: Secondary | ICD-10-CM

## 2022-01-17 MED ORDER — GENTAMICIN SULFATE 40 MG/ML IJ SOLN
80.0000 mg | Freq: Once | INTRAMUSCULAR | Status: AC
  Administered 2022-01-17: 80 mg via INTRAMUSCULAR

## 2022-01-17 MED ORDER — LEUPROLIDE ACETATE (6 MONTH) 45 MG ~~LOC~~ KIT
45.0000 mg | PACK | Freq: Once | SUBCUTANEOUS | Status: AC
  Administered 2022-01-17: 45 mg via SUBCUTANEOUS

## 2022-01-17 MED ORDER — LEVOFLOXACIN 500 MG PO TABS
500.0000 mg | ORAL_TABLET | Freq: Once | ORAL | Status: AC
  Administered 2022-01-17: 500 mg via ORAL

## 2022-01-17 NOTE — Patient Instructions (Signed)
Vitamin D 800-1000iu and Calcium 1000-1200mg daily while on Androgen Deprivation Therapy.  

## 2022-01-17 NOTE — Telephone Encounter (Signed)
-----   Message from Christean Grief, RN sent at 01/07/2022  2:14 PM EDT ----- ?Regarding: Eligard & Seed Marker placement ?Good Afternoon,  ? ?This patient will need to be scheduled for Eligard and [employer] Placement.   ?Once the schedule is made for the marker placement we will schedule the simulation appt.   ? ?Thanks,  ? ?Ranelle Oyster ? ?

## 2022-01-17 NOTE — Telephone Encounter (Signed)
PA for Eligard completed via [place] portal.  ?Josem Kaufmann #[id number].  ?Dates: 01/17/22 - 01/18/23.  ?

## 2022-01-17 NOTE — Progress Notes (Signed)
Eligard SubQ Injection  ? ?Due to Prostate Cancer patient is present today for a Eligard Injection. ? ?Medication: Eligard 6 month ?Dose: 45 mg  ?Location: left  ?Lot: [id number] ?Exp: 06/21/2023 ? ?Patient tolerated well, no complications were noted ? ?Performed by: Elberta Leatherwood, [place] ? ?Per Dr. Donella Stade patient is to continue therapy for 6 months .  continue on Vitamin D 800-1000iu and Calcium 1000-'1200mg'$  daily while on Androgen Deprivation Therapy.  ? ?

## 2022-01-19 ENCOUNTER — Encounter: Payer: Self-pay | Admitting: Urology

## 2022-01-19 NOTE — Progress Notes (Signed)
01/19/22 ? ?CC: gold fiducial marker placement ? ?HPI: 81 y.o. male with prostate cancer who presents today for placement of fiducial seed markers in anticipation of his upcoming IMRT with Dr. Baruch Gouty. ? ?Prostate Gold fiducial Marker Placement Procedure  ? ?Informed consent was obtained after discussing risks/benefits of the procedure.  A time out was performed to ensure correct patient identity. ? ?Pre-Procedure: ?- Gentamicin given prophylactically ?- PO Levaquin 500 mg also given today ? ?Procedure: ?- Lidocaine jelly was administered per rectum ?- Rectal ultrasound probe was placed without difficulty and the prostate visualized ?- Prostatic block performed with 10 mL 1% Xylocaine ?- 3 fiducial gold seed markers placed, one at right base, one at left base, one at apex of prostate gland under transrectal ultrasound guidance ? ?Post-Procedure: ?- Patient tolerated the procedure well ?- He was counseled to seek immediate medical attention if experiences any severe pain, significant bleeding, or fevers ?- ADT x6 months recommended by radiation oncology and he received a leuprolide injection ? ? ? ?Miguelangel Giovanni, MD ? ?

## 2022-01-21 ENCOUNTER — Encounter
Admission: RE | Admit: 2022-01-21 | Discharge: 2022-01-21 | Disposition: A | Discharge status: Home / Self Care | Payer: Medicare Other | Source: Ambulatory Visit | Attending: Radiation Oncology | Admitting: Radiation Oncology

## 2022-01-21 DIAGNOSIS — C61 Malignant neoplasm of prostate: Secondary | ICD-10-CM | POA: Diagnosis present

## 2022-01-21 MED ORDER — TECHNETIUM TC 99M MEDRONATE IV KIT
23.3100 | PACK | Freq: Once | INTRAVENOUS | Status: AC | PRN
  Administered 2022-01-21: 23.31 via INTRAVENOUS

## 2022-01-22 ENCOUNTER — Ambulatory Visit
Admission: RE | Admit: 2022-01-22 | Discharge: 2022-01-22 | Disposition: A | Discharge status: Home / Self Care | Payer: Medicare Other | Source: Ambulatory Visit | Attending: Radiation Oncology | Admitting: Radiation Oncology

## 2022-01-22 DIAGNOSIS — Z510 Encounter for antineoplastic radiation therapy: Secondary | ICD-10-CM | POA: Insufficient documentation

## 2022-01-22 DIAGNOSIS — C61 Malignant neoplasm of prostate: Secondary | ICD-10-CM | POA: Diagnosis present

## 2022-01-27 DIAGNOSIS — Z510 Encounter for antineoplastic radiation therapy: Secondary | ICD-10-CM | POA: Diagnosis not present

## 2022-01-31 ENCOUNTER — Other Ambulatory Visit: Payer: Self-pay | Admitting: *Deleted

## 2022-01-31 DIAGNOSIS — C61 Malignant neoplasm of prostate: Secondary | ICD-10-CM

## 2022-02-03 ENCOUNTER — Ambulatory Visit: Admission: RE | Admit: 2022-02-03 | Payer: Medicare Other | Source: Ambulatory Visit

## 2022-02-04 ENCOUNTER — Ambulatory Visit
Admission: RE | Admit: 2022-02-04 | Discharge: 2022-02-04 | Disposition: A | Discharge status: Home / Self Care | Payer: Medicare Other | Source: Ambulatory Visit | Attending: Radiation Oncology | Admitting: Radiation Oncology

## 2022-02-04 ENCOUNTER — Other Ambulatory Visit: Payer: Self-pay

## 2022-02-04 DIAGNOSIS — Z510 Encounter for antineoplastic radiation therapy: Secondary | ICD-10-CM | POA: Diagnosis not present

## 2022-02-04 LAB — RAD ONC ARIA SESSION SUMMARY
Course Elapsed Days: 0
Plan Fractions Treated to Date: 1
Plan Prescribed Dose Per Fraction: 2.5 Gy
Plan Total Fractions Prescribed: 28
Plan Total Prescribed Dose: 70 Gy
Reference Point Dosage Given to Date: 2.5 Gy
Reference Point Session Dosage Given: 2.5 Gy
Session Number: 1

## 2022-02-05 ENCOUNTER — Other Ambulatory Visit: Payer: Self-pay

## 2022-02-05 ENCOUNTER — Ambulatory Visit
Admission: RE | Admit: 2022-02-05 | Discharge: 2022-02-05 | Disposition: A | Discharge status: Home / Self Care | Payer: Medicare Other | Source: Ambulatory Visit | Attending: Radiation Oncology | Admitting: Radiation Oncology

## 2022-02-05 DIAGNOSIS — Z510 Encounter for antineoplastic radiation therapy: Secondary | ICD-10-CM | POA: Diagnosis not present

## 2022-02-05 LAB — RAD ONC ARIA SESSION SUMMARY
Course Elapsed Days: 1
Plan Fractions Treated to Date: 2
Plan Prescribed Dose Per Fraction: 2.5 Gy
Plan Total Fractions Prescribed: 28
Plan Total Prescribed Dose: 70 Gy
Reference Point Dosage Given to Date: 5 Gy
Reference Point Session Dosage Given: 2.5 Gy
Session Number: 2

## 2022-02-06 ENCOUNTER — Ambulatory Visit
Admission: RE | Admit: 2022-02-06 | Discharge: 2022-02-06 | Disposition: A | Discharge status: Home / Self Care | Payer: Medicare Other | Source: Ambulatory Visit | Attending: Radiation Oncology | Admitting: Radiation Oncology

## 2022-02-06 ENCOUNTER — Other Ambulatory Visit: Payer: Self-pay

## 2022-02-06 DIAGNOSIS — Z510 Encounter for antineoplastic radiation therapy: Secondary | ICD-10-CM | POA: Diagnosis not present

## 2022-02-06 LAB — RAD ONC ARIA SESSION SUMMARY
Course Elapsed Days: 2
Plan Fractions Treated to Date: 3
Plan Prescribed Dose Per Fraction: 2.5 Gy
Plan Total Fractions Prescribed: 28
Plan Total Prescribed Dose: 70 Gy
Reference Point Dosage Given to Date: 7.5 Gy
Reference Point Session Dosage Given: 2.5 Gy
Session Number: 3

## 2022-02-07 ENCOUNTER — Other Ambulatory Visit: Payer: Self-pay

## 2022-02-07 ENCOUNTER — Ambulatory Visit
Admission: RE | Admit: 2022-02-07 | Discharge: 2022-02-07 | Disposition: A | Discharge status: Home / Self Care | Payer: Medicare Other | Source: Ambulatory Visit | Attending: Radiation Oncology | Admitting: Radiation Oncology

## 2022-02-07 DIAGNOSIS — Z510 Encounter for antineoplastic radiation therapy: Secondary | ICD-10-CM | POA: Diagnosis not present

## 2022-02-07 LAB — RAD ONC ARIA SESSION SUMMARY
Course Elapsed Days: 3
Plan Fractions Treated to Date: 4
Plan Prescribed Dose Per Fraction: 2.5 Gy
Plan Total Fractions Prescribed: 28
Plan Total Prescribed Dose: 70 Gy
Reference Point Dosage Given to Date: 10 Gy
Reference Point Session Dosage Given: 2.5 Gy
Session Number: 4

## 2022-02-10 ENCOUNTER — Ambulatory Visit
Admission: RE | Admit: 2022-02-10 | Discharge: 2022-02-10 | Disposition: A | Discharge status: Home / Self Care | Payer: Medicare Other | Source: Ambulatory Visit | Attending: Radiation Oncology | Admitting: Radiation Oncology

## 2022-02-10 ENCOUNTER — Other Ambulatory Visit: Payer: Self-pay

## 2022-02-10 DIAGNOSIS — Z510 Encounter for antineoplastic radiation therapy: Secondary | ICD-10-CM | POA: Diagnosis not present

## 2022-02-10 LAB — RAD ONC ARIA SESSION SUMMARY
Course Elapsed Days: 6
Plan Fractions Treated to Date: 5
Plan Prescribed Dose Per Fraction: 2.5 Gy
Plan Total Fractions Prescribed: 28
Plan Total Prescribed Dose: 70 Gy
Reference Point Dosage Given to Date: 12.5 Gy
Reference Point Session Dosage Given: 2.5 Gy
Session Number: 5

## 2022-02-11 ENCOUNTER — Other Ambulatory Visit: Payer: Self-pay

## 2022-02-11 ENCOUNTER — Ambulatory Visit
Admission: RE | Admit: 2022-02-11 | Discharge: 2022-02-11 | Disposition: A | Discharge status: Home / Self Care | Payer: Medicare Other | Source: Ambulatory Visit | Attending: Radiation Oncology | Admitting: Radiation Oncology

## 2022-02-11 DIAGNOSIS — Z510 Encounter for antineoplastic radiation therapy: Secondary | ICD-10-CM | POA: Diagnosis not present

## 2022-02-11 LAB — RAD ONC ARIA SESSION SUMMARY
Course Elapsed Days: 7
Plan Fractions Treated to Date: 6
Plan Prescribed Dose Per Fraction: 2.5 Gy
Plan Total Fractions Prescribed: 28
Plan Total Prescribed Dose: 70 Gy
Reference Point Dosage Given to Date: 15 Gy
Reference Point Session Dosage Given: 2.5 Gy
Session Number: 6

## 2022-02-12 ENCOUNTER — Other Ambulatory Visit: Payer: Self-pay

## 2022-02-12 ENCOUNTER — Ambulatory Visit
Admission: RE | Admit: 2022-02-12 | Discharge: 2022-02-12 | Disposition: A | Discharge status: Home / Self Care | Payer: Medicare Other | Source: Ambulatory Visit | Attending: Radiation Oncology | Admitting: Radiation Oncology

## 2022-02-12 DIAGNOSIS — Z510 Encounter for antineoplastic radiation therapy: Secondary | ICD-10-CM | POA: Diagnosis not present

## 2022-02-12 LAB — RAD ONC ARIA SESSION SUMMARY
Course Elapsed Days: 8
Plan Fractions Treated to Date: 7
Plan Prescribed Dose Per Fraction: 2.5 Gy
Plan Total Fractions Prescribed: 28
Plan Total Prescribed Dose: 70 Gy
Reference Point Dosage Given to Date: 17.5 Gy
Reference Point Session Dosage Given: 2.5 Gy
Session Number: 7

## 2022-02-13 ENCOUNTER — Ambulatory Visit
Admission: RE | Admit: 2022-02-13 | Discharge: 2022-02-13 | Disposition: A | Discharge status: Home / Self Care | Payer: Medicare Other | Source: Ambulatory Visit | Attending: Radiation Oncology | Admitting: Radiation Oncology

## 2022-02-13 ENCOUNTER — Other Ambulatory Visit: Payer: Self-pay

## 2022-02-13 DIAGNOSIS — Z510 Encounter for antineoplastic radiation therapy: Secondary | ICD-10-CM | POA: Diagnosis not present

## 2022-02-13 LAB — RAD ONC ARIA SESSION SUMMARY
Course Elapsed Days: 9
Plan Fractions Treated to Date: 8
Plan Prescribed Dose Per Fraction: 2.5 Gy
Plan Total Fractions Prescribed: 28
Plan Total Prescribed Dose: 70 Gy
Reference Point Dosage Given to Date: 20 Gy
Reference Point Session Dosage Given: 2.5 Gy
Session Number: 8

## 2022-02-14 ENCOUNTER — Other Ambulatory Visit: Payer: Self-pay

## 2022-02-14 ENCOUNTER — Ambulatory Visit
Admission: RE | Admit: 2022-02-14 | Discharge: 2022-02-14 | Disposition: A | Discharge status: Home / Self Care | Payer: Medicare Other | Source: Ambulatory Visit | Attending: Radiation Oncology | Admitting: Radiation Oncology

## 2022-02-14 DIAGNOSIS — Z510 Encounter for antineoplastic radiation therapy: Secondary | ICD-10-CM | POA: Diagnosis not present

## 2022-02-14 LAB — RAD ONC ARIA SESSION SUMMARY
Course Elapsed Days: 10
Plan Fractions Treated to Date: 9
Plan Prescribed Dose Per Fraction: 2.5 Gy
Plan Total Fractions Prescribed: 28
Plan Total Prescribed Dose: 70 Gy
Reference Point Dosage Given to Date: 22.5 Gy
Reference Point Session Dosage Given: 2.5 Gy
Session Number: 9

## 2022-02-17 ENCOUNTER — Inpatient Hospital Stay: Payer: Medicare Other | Attending: Radiation Oncology

## 2022-02-17 ENCOUNTER — Other Ambulatory Visit: Payer: Self-pay

## 2022-02-17 ENCOUNTER — Ambulatory Visit
Admission: RE | Admit: 2022-02-17 | Discharge: 2022-02-17 | Disposition: A | Discharge status: Home / Self Care | Payer: Medicare Other | Source: Ambulatory Visit | Attending: Radiation Oncology | Admitting: Radiation Oncology

## 2022-02-17 DIAGNOSIS — C61 Malignant neoplasm of prostate: Secondary | ICD-10-CM | POA: Insufficient documentation

## 2022-02-17 DIAGNOSIS — Z510 Encounter for antineoplastic radiation therapy: Secondary | ICD-10-CM | POA: Insufficient documentation

## 2022-02-17 LAB — CBC
HCT: 39.7 % (ref 39.0–52.0)
Hemoglobin: 13.8 g/dL (ref 13.0–17.0)
MCH: 32.6 pg (ref 26.0–34.0)
MCHC: 34.8 g/dL (ref 30.0–36.0)
MCV: 93.9 fL (ref 80.0–100.0)
Platelets: 172 10*3/uL (ref 150–400)
RBC: 4.23 MIL/uL (ref 4.22–5.81)
RDW: 13 % (ref 11.5–15.5)
WBC: 5.8 10*3/uL (ref 4.0–10.5)
nRBC: 0 % (ref 0.0–0.2)

## 2022-02-17 LAB — RAD ONC ARIA SESSION SUMMARY
Course Elapsed Days: 13
Plan Fractions Treated to Date: 10
Plan Prescribed Dose Per Fraction: 2.5 Gy
Plan Total Fractions Prescribed: 28
Plan Total Prescribed Dose: 70 Gy
Reference Point Dosage Given to Date: 25 Gy
Reference Point Session Dosage Given: 2.5 Gy
Session Number: 10

## 2022-02-18 ENCOUNTER — Other Ambulatory Visit: Payer: Self-pay

## 2022-02-18 ENCOUNTER — Ambulatory Visit
Admission: RE | Admit: 2022-02-18 | Discharge: 2022-02-18 | Disposition: A | Discharge status: Home / Self Care | Payer: Medicare Other | Source: Ambulatory Visit | Attending: Radiation Oncology | Admitting: Radiation Oncology

## 2022-02-18 DIAGNOSIS — Z510 Encounter for antineoplastic radiation therapy: Secondary | ICD-10-CM | POA: Diagnosis not present

## 2022-02-18 LAB — RAD ONC ARIA SESSION SUMMARY
Course Elapsed Days: 14
Plan Fractions Treated to Date: 11
Plan Prescribed Dose Per Fraction: 2.5 Gy
Plan Total Fractions Prescribed: 28
Plan Total Prescribed Dose: 70 Gy
Reference Point Dosage Given to Date: 27.5 Gy
Reference Point Session Dosage Given: 2.5 Gy
Session Number: 11

## 2022-02-19 ENCOUNTER — Other Ambulatory Visit: Payer: Self-pay

## 2022-02-19 ENCOUNTER — Ambulatory Visit
Admission: RE | Admit: 2022-02-19 | Discharge: 2022-02-19 | Disposition: A | Discharge status: Home / Self Care | Payer: Medicare Other | Source: Ambulatory Visit | Attending: Radiation Oncology | Admitting: Radiation Oncology

## 2022-02-19 DIAGNOSIS — Z510 Encounter for antineoplastic radiation therapy: Secondary | ICD-10-CM | POA: Diagnosis not present

## 2022-02-19 LAB — RAD ONC ARIA SESSION SUMMARY
Course Elapsed Days: 15
Plan Fractions Treated to Date: 12
Plan Prescribed Dose Per Fraction: 2.5 Gy
Plan Total Fractions Prescribed: 28
Plan Total Prescribed Dose: 70 Gy
Reference Point Dosage Given to Date: 30 Gy
Reference Point Session Dosage Given: 2.5 Gy
Session Number: 12

## 2022-02-20 ENCOUNTER — Other Ambulatory Visit: Payer: Self-pay

## 2022-02-20 ENCOUNTER — Ambulatory Visit
Admission: RE | Admit: 2022-02-20 | Discharge: 2022-02-20 | Disposition: A | Discharge status: Home / Self Care | Payer: Medicare Other | Source: Ambulatory Visit | Attending: Radiation Oncology | Admitting: Radiation Oncology

## 2022-02-20 DIAGNOSIS — Z510 Encounter for antineoplastic radiation therapy: Secondary | ICD-10-CM | POA: Diagnosis not present

## 2022-02-20 LAB — RAD ONC ARIA SESSION SUMMARY
Course Elapsed Days: 16
Plan Fractions Treated to Date: 13
Plan Prescribed Dose Per Fraction: 2.5 Gy
Plan Total Fractions Prescribed: 28
Plan Total Prescribed Dose: 70 Gy
Reference Point Dosage Given to Date: 32.5 Gy
Reference Point Session Dosage Given: 2.5 Gy
Session Number: 13

## 2022-02-21 ENCOUNTER — Ambulatory Visit
Admission: RE | Admit: 2022-02-21 | Discharge: 2022-02-21 | Disposition: A | Discharge status: Home / Self Care | Payer: Medicare Other | Source: Ambulatory Visit | Attending: Radiation Oncology | Admitting: Radiation Oncology

## 2022-02-21 ENCOUNTER — Other Ambulatory Visit: Payer: Self-pay

## 2022-02-21 DIAGNOSIS — Z510 Encounter for antineoplastic radiation therapy: Secondary | ICD-10-CM | POA: Diagnosis not present

## 2022-02-21 LAB — RAD ONC ARIA SESSION SUMMARY
Course Elapsed Days: 17
Plan Fractions Treated to Date: 14
Plan Prescribed Dose Per Fraction: 2.5 Gy
Plan Total Fractions Prescribed: 28
Plan Total Prescribed Dose: 70 Gy
Reference Point Dosage Given to Date: 35 Gy
Reference Point Session Dosage Given: 2.5 Gy
Session Number: 14

## 2022-02-24 ENCOUNTER — Ambulatory Visit
Admission: RE | Admit: 2022-02-24 | Discharge: 2022-02-24 | Disposition: A | Discharge status: Home / Self Care | Payer: Medicare Other | Source: Ambulatory Visit | Attending: Radiation Oncology | Admitting: Radiation Oncology

## 2022-02-24 ENCOUNTER — Other Ambulatory Visit: Payer: Self-pay

## 2022-02-24 DIAGNOSIS — Z510 Encounter for antineoplastic radiation therapy: Secondary | ICD-10-CM | POA: Diagnosis not present

## 2022-02-24 LAB — RAD ONC ARIA SESSION SUMMARY
Course Elapsed Days: 20
Plan Fractions Treated to Date: 15
Plan Prescribed Dose Per Fraction: 2.5 Gy
Plan Total Fractions Prescribed: 28
Plan Total Prescribed Dose: 70 Gy
Reference Point Dosage Given to Date: 37.5 Gy
Reference Point Session Dosage Given: 2.5 Gy
Session Number: 15

## 2022-02-25 ENCOUNTER — Other Ambulatory Visit: Payer: Self-pay

## 2022-02-25 ENCOUNTER — Ambulatory Visit
Admission: RE | Admit: 2022-02-25 | Discharge: 2022-02-25 | Disposition: A | Discharge status: Home / Self Care | Payer: Medicare Other | Source: Ambulatory Visit | Attending: Radiation Oncology | Admitting: Radiation Oncology

## 2022-02-25 DIAGNOSIS — Z510 Encounter for antineoplastic radiation therapy: Secondary | ICD-10-CM | POA: Diagnosis not present

## 2022-02-25 LAB — RAD ONC ARIA SESSION SUMMARY
Course Elapsed Days: 21
Plan Fractions Treated to Date: 16
Plan Prescribed Dose Per Fraction: 2.5 Gy
Plan Total Fractions Prescribed: 28
Plan Total Prescribed Dose: 70 Gy
Reference Point Dosage Given to Date: 40 Gy
Reference Point Session Dosage Given: 2.5 Gy
Session Number: 16

## 2022-02-26 ENCOUNTER — Other Ambulatory Visit: Payer: Self-pay

## 2022-02-26 ENCOUNTER — Ambulatory Visit
Admission: RE | Admit: 2022-02-26 | Discharge: 2022-02-26 | Disposition: A | Discharge status: Home / Self Care | Payer: Medicare Other | Source: Ambulatory Visit | Attending: Radiation Oncology | Admitting: Radiation Oncology

## 2022-02-26 DIAGNOSIS — Z510 Encounter for antineoplastic radiation therapy: Secondary | ICD-10-CM | POA: Diagnosis not present

## 2022-02-26 LAB — RAD ONC ARIA SESSION SUMMARY
Course Elapsed Days: 22
Plan Fractions Treated to Date: 17
Plan Prescribed Dose Per Fraction: 2.5 Gy
Plan Total Fractions Prescribed: 28
Plan Total Prescribed Dose: 70 Gy
Reference Point Dosage Given to Date: 42.5 Gy
Reference Point Session Dosage Given: 2.5 Gy
Session Number: 17

## 2022-02-27 ENCOUNTER — Other Ambulatory Visit: Payer: Self-pay

## 2022-02-27 ENCOUNTER — Ambulatory Visit
Admission: RE | Admit: 2022-02-27 | Discharge: 2022-02-27 | Disposition: A | Discharge status: Home / Self Care | Payer: Medicare Other | Source: Ambulatory Visit | Attending: Radiation Oncology | Admitting: Radiation Oncology

## 2022-02-27 DIAGNOSIS — Z510 Encounter for antineoplastic radiation therapy: Secondary | ICD-10-CM | POA: Diagnosis not present

## 2022-02-27 LAB — RAD ONC ARIA SESSION SUMMARY
Course Elapsed Days: 23
Plan Fractions Treated to Date: 18
Plan Prescribed Dose Per Fraction: 2.5 Gy
Plan Total Fractions Prescribed: 28
Plan Total Prescribed Dose: 70 Gy
Reference Point Dosage Given to Date: 45 Gy
Reference Point Session Dosage Given: 2.5 Gy
Session Number: 18

## 2022-02-28 ENCOUNTER — Other Ambulatory Visit: Payer: Self-pay

## 2022-02-28 ENCOUNTER — Ambulatory Visit
Admission: RE | Admit: 2022-02-28 | Discharge: 2022-02-28 | Disposition: A | Discharge status: Home / Self Care | Payer: Medicare Other | Source: Ambulatory Visit | Attending: Radiation Oncology | Admitting: Radiation Oncology

## 2022-02-28 DIAGNOSIS — Z510 Encounter for antineoplastic radiation therapy: Secondary | ICD-10-CM | POA: Diagnosis not present

## 2022-02-28 LAB — RAD ONC ARIA SESSION SUMMARY
Course Elapsed Days: 24
Plan Fractions Treated to Date: 19
Plan Prescribed Dose Per Fraction: 2.5 Gy
Plan Total Fractions Prescribed: 28
Plan Total Prescribed Dose: 70 Gy
Reference Point Dosage Given to Date: 47.5 Gy
Reference Point Session Dosage Given: 2.5 Gy
Session Number: 19

## 2022-03-03 ENCOUNTER — Other Ambulatory Visit: Payer: Self-pay

## 2022-03-03 ENCOUNTER — Inpatient Hospital Stay: Payer: Medicare Other

## 2022-03-03 ENCOUNTER — Ambulatory Visit
Admission: RE | Admit: 2022-03-03 | Discharge: 2022-03-03 | Disposition: A | Discharge status: Home / Self Care | Payer: Medicare Other | Source: Ambulatory Visit | Attending: Radiation Oncology | Admitting: Radiation Oncology

## 2022-03-03 DIAGNOSIS — C61 Malignant neoplasm of prostate: Secondary | ICD-10-CM

## 2022-03-03 DIAGNOSIS — Z510 Encounter for antineoplastic radiation therapy: Secondary | ICD-10-CM | POA: Diagnosis not present

## 2022-03-03 LAB — CBC
HCT: 36.8 % — ABNORMAL LOW (ref 39.0–52.0)
Hemoglobin: 12.7 g/dL — ABNORMAL LOW (ref 13.0–17.0)
MCH: 32.6 pg (ref 26.0–34.0)
MCHC: 34.5 g/dL (ref 30.0–36.0)
MCV: 94.4 fL (ref 80.0–100.0)
Platelets: 149 10*3/uL — ABNORMAL LOW (ref 150–400)
RBC: 3.9 MIL/uL — ABNORMAL LOW (ref 4.22–5.81)
RDW: 13.1 % (ref 11.5–15.5)
WBC: 5.1 10*3/uL (ref 4.0–10.5)
nRBC: 0 % (ref 0.0–0.2)

## 2022-03-03 LAB — RAD ONC ARIA SESSION SUMMARY
Course Elapsed Days: 27
Plan Fractions Treated to Date: 20
Plan Prescribed Dose Per Fraction: 2.5 Gy
Plan Total Fractions Prescribed: 28
Plan Total Prescribed Dose: 70 Gy
Reference Point Dosage Given to Date: 50 Gy
Reference Point Session Dosage Given: 2.5 Gy
Session Number: 20

## 2022-03-04 ENCOUNTER — Other Ambulatory Visit: Payer: Self-pay

## 2022-03-04 ENCOUNTER — Ambulatory Visit
Admission: RE | Admit: 2022-03-04 | Discharge: 2022-03-04 | Disposition: A | Discharge status: Home / Self Care | Payer: Medicare Other | Source: Ambulatory Visit | Attending: Radiation Oncology | Admitting: Radiation Oncology

## 2022-03-04 DIAGNOSIS — Z510 Encounter for antineoplastic radiation therapy: Secondary | ICD-10-CM | POA: Diagnosis not present

## 2022-03-04 LAB — RAD ONC ARIA SESSION SUMMARY
Course Elapsed Days: 28
Plan Fractions Treated to Date: 21
Plan Prescribed Dose Per Fraction: 2.5 Gy
Plan Total Fractions Prescribed: 28
Plan Total Prescribed Dose: 70 Gy
Reference Point Dosage Given to Date: 52.5 Gy
Reference Point Session Dosage Given: 2.5 Gy
Session Number: 21

## 2022-03-05 ENCOUNTER — Other Ambulatory Visit: Payer: Self-pay

## 2022-03-05 ENCOUNTER — Ambulatory Visit
Admission: RE | Admit: 2022-03-05 | Discharge: 2022-03-05 | Disposition: A | Discharge status: Home / Self Care | Payer: Medicare Other | Source: Ambulatory Visit | Attending: Radiation Oncology | Admitting: Radiation Oncology

## 2022-03-05 DIAGNOSIS — Z510 Encounter for antineoplastic radiation therapy: Secondary | ICD-10-CM | POA: Diagnosis not present

## 2022-03-05 LAB — RAD ONC ARIA SESSION SUMMARY
Course Elapsed Days: 29
Plan Fractions Treated to Date: 22
Plan Prescribed Dose Per Fraction: 2.5 Gy
Plan Total Fractions Prescribed: 28
Plan Total Prescribed Dose: 70 Gy
Reference Point Dosage Given to Date: 55 Gy
Reference Point Session Dosage Given: 2.5 Gy
Session Number: 22

## 2022-03-06 ENCOUNTER — Other Ambulatory Visit: Payer: Self-pay

## 2022-03-06 ENCOUNTER — Ambulatory Visit
Admission: RE | Admit: 2022-03-06 | Discharge: 2022-03-06 | Disposition: A | Discharge status: Home / Self Care | Payer: Medicare Other | Source: Ambulatory Visit | Attending: Radiation Oncology | Admitting: Radiation Oncology

## 2022-03-06 DIAGNOSIS — Z510 Encounter for antineoplastic radiation therapy: Secondary | ICD-10-CM | POA: Diagnosis not present

## 2022-03-06 LAB — RAD ONC ARIA SESSION SUMMARY
Course Elapsed Days: 30
Plan Fractions Treated to Date: 23
Plan Prescribed Dose Per Fraction: 2.5 Gy
Plan Total Fractions Prescribed: 28
Plan Total Prescribed Dose: 70 Gy
Reference Point Dosage Given to Date: 57.5 Gy
Reference Point Session Dosage Given: 2.5 Gy
Session Number: 23

## 2022-03-07 ENCOUNTER — Other Ambulatory Visit: Payer: Self-pay

## 2022-03-07 ENCOUNTER — Ambulatory Visit
Admission: RE | Admit: 2022-03-07 | Discharge: 2022-03-07 | Disposition: A | Discharge status: Home / Self Care | Payer: Medicare Other | Source: Ambulatory Visit | Attending: Radiation Oncology | Admitting: Radiation Oncology

## 2022-03-07 DIAGNOSIS — Z510 Encounter for antineoplastic radiation therapy: Secondary | ICD-10-CM | POA: Diagnosis not present

## 2022-03-07 LAB — RAD ONC ARIA SESSION SUMMARY
Course Elapsed Days: 31
Plan Fractions Treated to Date: 24
Plan Prescribed Dose Per Fraction: 2.5 Gy
Plan Total Fractions Prescribed: 28
Plan Total Prescribed Dose: 70 Gy
Reference Point Dosage Given to Date: 60 Gy
Reference Point Session Dosage Given: 2.5 Gy
Session Number: 24

## 2022-03-10 ENCOUNTER — Other Ambulatory Visit: Payer: Self-pay

## 2022-03-10 ENCOUNTER — Ambulatory Visit
Admission: RE | Admit: 2022-03-10 | Discharge: 2022-03-10 | Disposition: A | Discharge status: Home / Self Care | Payer: Medicare Other | Source: Ambulatory Visit | Attending: Radiation Oncology | Admitting: Radiation Oncology

## 2022-03-10 DIAGNOSIS — Z510 Encounter for antineoplastic radiation therapy: Secondary | ICD-10-CM | POA: Diagnosis not present

## 2022-03-10 LAB — RAD ONC ARIA SESSION SUMMARY
Course Elapsed Days: 34
Plan Fractions Treated to Date: 25
Plan Prescribed Dose Per Fraction: 2.5 Gy
Plan Total Fractions Prescribed: 28
Plan Total Prescribed Dose: 70 Gy
Reference Point Dosage Given to Date: 62.5 Gy
Reference Point Session Dosage Given: 2.5 Gy
Session Number: 25

## 2022-03-11 ENCOUNTER — Other Ambulatory Visit: Payer: Self-pay

## 2022-03-11 ENCOUNTER — Ambulatory Visit
Admission: RE | Admit: 2022-03-11 | Discharge: 2022-03-11 | Disposition: A | Discharge status: Home / Self Care | Payer: Medicare Other | Source: Ambulatory Visit | Attending: Radiation Oncology | Admitting: Radiation Oncology

## 2022-03-11 DIAGNOSIS — Z510 Encounter for antineoplastic radiation therapy: Secondary | ICD-10-CM | POA: Diagnosis not present

## 2022-03-11 LAB — RAD ONC ARIA SESSION SUMMARY
Course Elapsed Days: 35
Plan Fractions Treated to Date: 26
Plan Prescribed Dose Per Fraction: 2.5 Gy
Plan Total Fractions Prescribed: 28
Plan Total Prescribed Dose: 70 Gy
Reference Point Dosage Given to Date: 65 Gy
Reference Point Session Dosage Given: 2.5 Gy
Session Number: 26

## 2022-03-12 ENCOUNTER — Ambulatory Visit
Admission: RE | Admit: 2022-03-12 | Discharge: 2022-03-12 | Disposition: A | Discharge status: Home / Self Care | Payer: Medicare Other | Source: Ambulatory Visit | Attending: Radiation Oncology | Admitting: Radiation Oncology

## 2022-03-12 ENCOUNTER — Other Ambulatory Visit: Payer: Self-pay

## 2022-03-12 DIAGNOSIS — Z510 Encounter for antineoplastic radiation therapy: Secondary | ICD-10-CM | POA: Diagnosis not present

## 2022-03-12 LAB — RAD ONC ARIA SESSION SUMMARY
Course Elapsed Days: 36
Plan Fractions Treated to Date: 27
Plan Prescribed Dose Per Fraction: 2.5 Gy
Plan Total Fractions Prescribed: 28
Plan Total Prescribed Dose: 70 Gy
Reference Point Dosage Given to Date: 67.5 Gy
Reference Point Session Dosage Given: 2.5 Gy
Session Number: 27

## 2022-03-13 ENCOUNTER — Other Ambulatory Visit: Payer: Self-pay

## 2022-03-13 ENCOUNTER — Ambulatory Visit
Admission: RE | Admit: 2022-03-13 | Discharge: 2022-03-13 | Disposition: A | Discharge status: Home / Self Care | Payer: Medicare Other | Source: Ambulatory Visit | Attending: Radiation Oncology | Admitting: Radiation Oncology

## 2022-03-13 DIAGNOSIS — Z510 Encounter for antineoplastic radiation therapy: Secondary | ICD-10-CM | POA: Diagnosis not present

## 2022-03-13 LAB — RAD ONC ARIA SESSION SUMMARY
Course Elapsed Days: 37
Plan Fractions Treated to Date: 28
Plan Prescribed Dose Per Fraction: 2.5 Gy
Plan Total Fractions Prescribed: 28
Plan Total Prescribed Dose: 70 Gy
Reference Point Dosage Given to Date: 70 Gy
Reference Point Session Dosage Given: 2.5 Gy
Session Number: 28

## 2022-03-14 ENCOUNTER — Ambulatory Visit: Payer: Medicare Other

## 2022-03-18 ENCOUNTER — Ambulatory Visit: Payer: Medicare Other

## 2022-03-19 ENCOUNTER — Ambulatory Visit: Payer: Medicare Other

## 2022-03-20 ENCOUNTER — Ambulatory Visit: Payer: Medicare Other

## 2022-03-21 ENCOUNTER — Ambulatory Visit: Payer: Medicare Other

## 2022-03-24 ENCOUNTER — Ambulatory Visit: Payer: Medicare Other

## 2022-03-25 ENCOUNTER — Ambulatory Visit: Payer: Medicare Other

## 2022-03-26 ENCOUNTER — Ambulatory Visit: Payer: Medicare Other

## 2022-03-27 ENCOUNTER — Ambulatory Visit: Payer: Medicare Other

## 2022-03-28 ENCOUNTER — Ambulatory Visit: Payer: Medicare Other

## 2022-03-31 ENCOUNTER — Ambulatory Visit: Payer: Medicare Other

## 2022-04-01 ENCOUNTER — Ambulatory Visit: Payer: Medicare Other

## 2022-04-18 ENCOUNTER — Ambulatory Visit: Payer: Medicare Other | Admitting: Radiation Oncology

## 2022-04-30 ENCOUNTER — Ambulatory Visit
Admission: RE | Admit: 2022-04-30 | Discharge: 2022-04-30 | Disposition: A | Discharge status: Home / Self Care | Payer: Medicare Other | Source: Ambulatory Visit | Attending: Radiation Oncology | Admitting: Radiation Oncology

## 2022-04-30 VITALS — BP 132/73 | HR 63 | Temp 97.5°F | Resp 18 | Ht 70.0 in | Wt 201.1 lb

## 2022-04-30 DIAGNOSIS — R351 Nocturia: Secondary | ICD-10-CM | POA: Diagnosis not present

## 2022-04-30 DIAGNOSIS — Z923 Personal history of irradiation: Secondary | ICD-10-CM | POA: Diagnosis not present

## 2022-04-30 DIAGNOSIS — C61 Malignant neoplasm of prostate: Secondary | ICD-10-CM | POA: Diagnosis not present

## 2022-04-30 NOTE — Progress Notes (Signed)
Radiation Oncology Follow up Note  Name: Bobby Nichols   Date:   04/30/2022 MRN:  [id number] DOB: October 29, 1940    This 81 y.o. 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  REFERRING PROVIDER: Baxter Hire, MD  HPI: Patient is an 81 year old male now out 1 month having completed.  IMRT radiation therapy to his prostate for a Gleason 7 (3+4) adenocarcinoma presenting with a PSA in the 15 range seen today in routine follow-up he is doing well he has some nocturia x3 no other lower urinary tract symptoms no diarrhea or fatigue.  COMPLICATIONS OF TREATMENT: none  FOLLOW UP COMPLIANCE: keeps appointments   PHYSICAL EXAM:  BP 132/73   Pulse 63   Temp (!) 97.5 F (36.4 C)   Resp 18   Ht '5\' 10"'$  (1.778 m)   Wt 201 lb 1.6 oz (91.2 kg)   BMI 28.85 kg/m  Well-developed well-nourished patient in NAD. HEENT reveals PERLA, EOMI, discs not visualized.  Oral cavity is clear. No oral mucosal lesions are identified. Neck is clear without evidence of cervical or supraclavicular adenopathy. Lungs are clear to A&P. Cardiac examination is essentially unremarkable with regular rate and rhythm without murmur rub or thrill. Abdomen is benign with no organomegaly or masses noted. Motor sensory and DTR levels are equal and symmetric in the upper and lower extremities. Cranial nerves II through XII are grossly intact. Proprioception is intact. No peripheral adenopathy or edema is identified. No motor or sensory levels are noted. Crude visual fields are within normal range.  RADIOLOGY RESULTS: No current films for review  PLAN: Present time patient is doing well with low side effect profile Minna start him on Flomax 1 tablet every evening.  I have asked to see him back in 3 months with a repeat PSA at that time.  Patient is to call with any concerns.  I would  like to take this opportunity to thank you for allowing me to participate in the care of your patient.Noreene Filbert, MD

## 2022-05-02 ENCOUNTER — Ambulatory Visit: Payer: Medicare Other | Admitting: Radiation Oncology

## 2022-05-05 ENCOUNTER — Other Ambulatory Visit: Payer: Self-pay | Admitting: *Deleted

## 2022-05-05 MED ORDER — TAMSULOSIN HCL 0.4 MG PO CAPS: 0.4000 mg | ORAL_CAPSULE | Freq: Every day | ORAL | 4 refills | Status: AC

## 2022-05-28 ENCOUNTER — Ambulatory Visit: Payer: Medicare Other | Admitting: Dermatology

## 2022-06-09 ENCOUNTER — Other Ambulatory Visit: Payer: Medicare Other

## 2022-06-16 ENCOUNTER — Other Ambulatory Visit: Payer: Medicare Other

## 2022-06-16 DIAGNOSIS — C61 Malignant neoplasm of prostate: Secondary | ICD-10-CM

## 2022-06-17 ENCOUNTER — Ambulatory Visit: Payer: Medicare Other | Admitting: Dermatology

## 2022-06-17 ENCOUNTER — Encounter: Payer: Self-pay | Admitting: Dermatology

## 2022-06-17 DIAGNOSIS — L814 Other melanin hyperpigmentation: Secondary | ICD-10-CM | POA: Diagnosis not present

## 2022-06-17 DIAGNOSIS — L570 Actinic keratosis: Secondary | ICD-10-CM | POA: Diagnosis not present

## 2022-06-17 DIAGNOSIS — L821 Other seborrheic keratosis: Secondary | ICD-10-CM | POA: Diagnosis not present

## 2022-06-17 DIAGNOSIS — L578 Other skin changes due to chronic exposure to nonionizing radiation: Secondary | ICD-10-CM

## 2022-06-17 DIAGNOSIS — D229 Melanocytic nevi, unspecified: Secondary | ICD-10-CM

## 2022-06-17 DIAGNOSIS — Z85828 Personal history of other malignant neoplasm of skin: Secondary | ICD-10-CM | POA: Diagnosis not present

## 2022-06-17 DIAGNOSIS — D1800 Hemangioma unspecified site: Secondary | ICD-10-CM

## 2022-06-17 DIAGNOSIS — Z1283 Encounter for screening for malignant neoplasm of skin: Secondary | ICD-10-CM

## 2022-06-17 LAB — PSA: Prostate Specific Ag, Serum: 0.2 ng/mL (ref 0.0–4.0)

## 2022-06-17 NOTE — Patient Instructions (Addendum)
Cryotherapy Aftercare  Wash gently with soap and water everyday.   Apply Vaseline daily until healed.    Recommend taking Heliocare sun protection supplement daily in sunny weather for additional sun protection. For maximum protection on the sunniest days, you can take up to 2 capsules of regular Heliocare OR take 1 capsule of Heliocare Ultra. For prolonged exposure (such as a full day in the sun), you can repeat your dose of the supplement 4 hours after your first dose. Heliocare can be purchased at  [facility], at some [facility] or at www.heliocare.com.     Recommend daily broad spectrum sunscreen SPF 30+ to sun-exposed areas, reapply every 2 hours as needed. Call for new or changing lesions.  Staying in the shade or wearing long sleeves, sun glasses (UVA+UVB protection) and wide brim hats (4-inch brim around the entire circumference of the hat) are also recommended for sun protection.      Melanoma ABCDEs  Melanoma is the most dangerous type of skin cancer, and is the leading cause of death from skin disease.  You are more likely to develop melanoma if you: Have light-colored skin, light-colored eyes, or red or blond hair Spend a lot of time in the sun Tan regularly, either outdoors or in a tanning bed Have had blistering sunburns, especially during childhood Have a close family member who has had a melanoma Have atypical moles or large birthmarks  Early detection of melanoma is key since treatment is typically straightforward and cure rates are extremely high if we catch it early.   The first sign of melanoma is often a change in a mole or a new dark spot.  The ABCDE system is a way of remembering the signs of melanoma.  A for asymmetry:  The two halves do not match. B for border:  The edges of the growth are irregular. C for color:  A mixture of colors are present instead of an even brown color. D for diameter:  Melanomas are usually (but not always) greater than 6mm -  the size of a pencil eraser. E for evolution:  The spot keeps changing in size, shape, and color.  Please check your skin once per month between visits. You can use a small mirror in front and a large mirror behind you to keep an eye on the back side or your body.   If you see any new or changing lesions before your next follow-up, please call to schedule a visit.  Please continue daily skin protection including broad spectrum sunscreen SPF 30+ to sun-exposed areas, reapplying every 2 hours as needed when you're outdoors.   Staying in the shade or wearing long sleeves, sun glasses (UVA+UVB protection) and wide brim hats (4-inch brim around the entire circumference of the hat) are also recommended for sun protection.     Due to recent changes in healthcare laws, you may see results of your pathology and/or laboratory studies on MyChart before the doctors have had a chance to review them. We understand that in some cases there may be results that are confusing or concerning to you. Please understand that not all results are received at the same time and often the doctors may need to interpret multiple results in order to provide you with the best plan of care or course of treatment. Therefore, we ask that you please give us 2 business days to thoroughly review all your results before contacting the office for clarification. Should we see a critical lab result, you will   be contacted sooner.   If You Need Anything After Your Visit  If you have any questions or concerns for your doctor, please call our main line at [phone] and press option 4 to reach your doctor's medical assistant. If no one answers, please leave a voicemail as directed and we will return your call as soon as possible. Messages left after 4 pm will be answered the following business day.   You may also send us a message via MyChart. We typically respond to MyChart messages within 1-2 business days.  For prescription refills,  please ask your pharmacy to contact our office. Our fax number is [phone].  If you have an urgent issue when the clinic is closed that cannot wait until the next business day, you can page your doctor at the number below.    Please note that while we do our best to be available for urgent issues outside of office hours, we are not available 24/7.   If you have an urgent issue and are unable to reach us, you may choose to seek medical care at your doctor's office, retail clinic, urgent care center, or emergency room.  If you have a medical emergency, please immediately call 911 or go to the emergency department.  Pager Numbers  - Dr. Kowalski: [phone]  - Dr. Moye: [phone]  - Dr. Stewart: [phone]  In the event of inclement weather, please call our main line at [phone] for an update on the status of any delays or closures.  Dermatology Medication Tips: Please keep the boxes that topical medications come in in order to help keep track of the instructions about where and how to use these. Pharmacies typically print the medication instructions only on the boxes and not directly on the medication tubes.   If your medication is too expensive, please contact our office at [phone] option 4 or send us a message through MyChart.   We are unable to tell what your co-pay for medications will be in advance as this is different depending on your insurance coverage. However, we may be able to find a substitute medication at lower cost or fill out paperwork to get insurance to cover a needed medication.   If a prior authorization is required to get your medication covered by your insurance company, please allow us 1-2 business days to complete this process.  Drug prices often vary depending on where the prescription is filled and some pharmacies may offer cheaper prices.  The website www.goodrx.com contains coupons for medications through different pharmacies. The prices  here do not account for what the cost may be with help from insurance (it may be cheaper with your insurance), but the website can give you the price if you did not use any insurance.  - You can print the associated coupon and take it with your prescription to the pharmacy.  - You may also stop by our office during regular business hours and pick up a GoodRx coupon card.  - If you need your prescription sent electronically to a different pharmacy, notify our office through [facility] MyChart or by phone at [phone] option 4.     Si Usted Necesita Algo Despus de Su Visita  Tambin puede enviarnos un mensaje a travs de MyChart. Por lo general respondemos a los mensajes de MyChart en el transcurso de 1 a 2 das hbiles.  Para renovar recetas, por favor pida a su farmacia que se ponga en contacto con nuestra oficina. Nuestro nmero de fax   es el [phone].  Si tiene un asunto urgente cuando la clnica est cerrada y que no puede esperar hasta el siguiente da hbil, puede llamar/localizar a su doctor(a) al nmero que aparece a continuacin.   Por favor, tenga en cuenta que aunque hacemos todo lo posible para estar disponibles para asuntos urgentes fuera del horario de oficina, no estamos disponibles las 24 horas del da, los 7 das de la semana.   Si tiene un problema urgente y no puede comunicarse con nosotros, puede optar por buscar atencin mdica  en el consultorio de su doctor(a), en una clnica privada, en un centro de atencin urgente o en una sala de emergencias.  Si tiene una emergencia mdica, por favor llame inmediatamente al 911 o vaya a la sala de emergencias.  Nmeros de bper  - Dr. Kowalski: [phone]  - Dra. Moye: [phone]  - Dra. Stewart: [phone]  En caso de inclemencias del tiempo, por favor llame a nuestra lnea principal al [phone] para una actualizacin sobre el estado de cualquier retraso o cierre.  Consejos para la medicacin en  dermatologa: Por favor, guarde las cajas en las que vienen los medicamentos de uso tpico para ayudarle a seguir las instrucciones sobre dnde y cmo usarlos. Las farmacias generalmente imprimen las instrucciones del medicamento slo en las cajas y no directamente en los tubos del medicamento.   Si su medicamento es muy caro, por favor, pngase en contacto con nuestra oficina llamando al [phone] y presione la opcin 4 o envenos un mensaje a travs de MyChart.   No podemos decirle cul ser su copago por los medicamentos por adelantado ya que esto es diferente dependiendo de la cobertura de su seguro. Sin embargo, es posible que podamos encontrar un medicamento sustituto a menor costo o llenar un formulario para que el seguro cubra el medicamento que se considera necesario.   Si se requiere una autorizacin previa para que su compaa de seguros cubra su medicamento, por favor permtanos de 1 a 2 das hbiles para completar este proceso.  Los precios de los medicamentos varan con frecuencia dependiendo del lugar de dnde se surte la receta y alguna farmacias pueden ofrecer precios ms baratos.  El sitio web www.goodrx.com tiene cupones para medicamentos de diferentes farmacias. Los precios aqu no tienen en cuenta lo que podra costar con la ayuda del seguro (puede ser ms barato con su seguro), pero el sitio web puede darle el precio si no utiliz ningn seguro.  - Puede imprimir el cupn correspondiente y llevarlo con su receta a la farmacia.  - Tambin puede pasar por nuestra oficina durante el horario de atencin regular y recoger una tarjeta de cupones de GoodRx.  - Si necesita que su receta se enve electrnicamente a una farmacia diferente, informe a nuestra oficina a travs de MyChart de [facility] o por telfono llamando al [phone] y presione la opcin 4.  

## 2022-06-17 NOTE — Progress Notes (Signed)
Follow-Up Visit   Subjective  Bobby Nichols is a 81 y.o. male who presents for the following: Annual Exam (Hx of BCC's. Areas of concern on face). The patient presents for Total-Body Skin Exam (TBSE) for skin cancer screening and mole check.  The patient has spots, moles and lesions to be evaluated, some may be new or changing and the patient has concerns that these could be cancer.   The following portions of the chart were reviewed this encounter and updated as appropriate:  Tobacco  Allergies  Meds  Problems  Med Hx  Surg Hx  Fam Hx      Review of Systems: No other skin or systemic complaints except as noted in HPI or Assessment and Plan.   Objective  Well appearing patient in no apparent distress; mood and affect are within normal limits.  A full examination was performed including scalp, head, eyes, ears, nose, lips, neck, chest, axillae, abdomen, back, buttocks, bilateral upper extremities, bilateral lower extremities, hands, feet, fingers, toes, fingernails, and toenails. All findings within normal limits unless otherwise noted below.  Dorsum Nose x1 Erythematous thin papules/macules with gritty scale.    Assessment & Plan   History of Basal Cell Carcinoma of the Skin - No evidence of recurrence today - Recommend regular full body skin exams - Recommend daily broad spectrum sunscreen SPF 30+ to sun-exposed areas, reapply every 2 hours as needed.  - Call if any new or changing lesions are noted between office visits  Lentigines - Scattered tan macules - Due to sun exposure - Benign-appearing, observe - Recommend daily broad spectrum sunscreen SPF 30+ to sun-exposed areas, reapply every 2 hours as needed. - Call for any changes  Seborrheic Keratoses - Stuck-on, waxy, tan-brown papules and/or plaques  - Benign-appearing - Discussed benign etiology and prognosis. - Observe - Call for any changes  Melanocytic Nevi - Tan-brown and/or pink-flesh-colored  symmetric macules and papules - Benign appearing on exam today - Observation - Call clinic for new or changing moles - Recommend daily use of broad spectrum spf 30+ sunscreen to sun-exposed areas.   Hemangiomas - Red papules - Discussed benign nature - Observe - Call for any changes  Actinic Damage - Chronic condition, secondary to cumulative UV/sun exposure - diffuse scaly erythematous macules with underlying dyspigmentation - Recommend daily broad spectrum sunscreen SPF 30+ to sun-exposed areas, reapply every 2 hours as needed.  - Staying in the shade or wearing long sleeves, sun glasses (UVA+UVB protection) and wide brim hats (4-inch brim around the entire circumference of the hat) are also recommended for sun protection.  - Call for new or changing lesions.  Skin cancer screening performed today.  AK (actinic keratosis) Dorsum Nose x1  Actinic keratoses are precancerous spots that appear secondary to cumulative UV radiation exposure/sun exposure over time. They are chronic with expected duration over 1 year. A portion of actinic keratoses will progress to squamous cell carcinoma of the skin. It is not possible to reliably predict which spots will progress to skin cancer and so treatment is recommended to prevent development of skin cancer.  Recommend daily broad spectrum sunscreen SPF 30+ to sun-exposed areas, reapply every 2 hours as needed.  Recommend staying in the shade or wearing long sleeves, sun glasses (UVA+UVB protection) and wide brim hats (4-inch brim around the entire circumference of the hat). Call for new or changing lesions.  Destruction of lesion - Dorsum Nose x1  Destruction method: cryotherapy   Informed consent: discussed and consent  obtained   Lesion destroyed using liquid nitrogen: Yes   Outcome: patient tolerated procedure well with no complications   Post-procedure details: wound care instructions given   Additional details:  Prior to procedure,  discussed risks of blister formation, small wound, skin dyspigmentation, or rare scar following cryotherapy. Recommend Vaseline ointment to treated areas while healing.    Return in about 6 months (around 12/18/2022) for TBSE, HxBCC's.  I, Emelia Salisbury, CMA, am acting as scribe for Forest Gleason, MD.  Documentation: I have reviewed the above documentation for accuracy and completeness, and I agree with the above.  Forest Gleason, MD

## 2022-06-24 ENCOUNTER — Encounter: Payer: Self-pay | Admitting: *Deleted

## 2022-07-10 ENCOUNTER — Encounter: Payer: Medicare Other | Admitting: Dermatology

## 2022-07-23 ENCOUNTER — Other Ambulatory Visit: Payer: Self-pay | Admitting: *Deleted

## 2022-07-23 DIAGNOSIS — C61 Malignant neoplasm of prostate: Secondary | ICD-10-CM

## 2022-07-29 ENCOUNTER — Inpatient Hospital Stay: Payer: Medicare Other | Attending: Radiation Oncology

## 2022-07-29 DIAGNOSIS — C61 Malignant neoplasm of prostate: Secondary | ICD-10-CM | POA: Diagnosis present

## 2022-07-29 LAB — CBC
HCT: 37.9 % — ABNORMAL LOW (ref 39.0–52.0)
Hemoglobin: 13 g/dL (ref 13.0–17.0)
MCH: 32.6 pg (ref 26.0–34.0)
MCHC: 34.3 g/dL (ref 30.0–36.0)
MCV: 95 fL (ref 80.0–100.0)
Platelets: 149 10*3/uL — ABNORMAL LOW (ref 150–400)
RBC: 3.99 MIL/uL — ABNORMAL LOW (ref 4.22–5.81)
RDW: 13.1 % (ref 11.5–15.5)
WBC: 5 10*3/uL (ref 4.0–10.5)
nRBC: 0 % (ref 0.0–0.2)

## 2022-07-29 LAB — PSA: Prostatic Specific Antigen: 0.11 ng/mL (ref 0.00–4.00)

## 2022-07-31 ENCOUNTER — Other Ambulatory Visit: Payer: Medicare Other

## 2022-08-06 ENCOUNTER — Encounter: Payer: Self-pay | Admitting: Radiation Oncology

## 2022-08-06 ENCOUNTER — Ambulatory Visit
Admission: RE | Admit: 2022-08-06 | Discharge: 2022-08-06 | Disposition: A | Discharge status: Home / Self Care | Payer: Medicare Other | Source: Ambulatory Visit | Attending: Radiation Oncology | Admitting: Radiation Oncology

## 2022-08-06 VITALS — Resp 18 | Ht 70.0 in | Wt 199.0 lb

## 2022-08-06 DIAGNOSIS — C61 Malignant neoplasm of prostate: Secondary | ICD-10-CM | POA: Diagnosis present

## 2022-08-06 DIAGNOSIS — Z923 Personal history of irradiation: Secondary | ICD-10-CM | POA: Diagnosis not present

## 2022-08-06 NOTE — Progress Notes (Signed)
Radiation Oncology Follow up Note  Name: Bobby Nichols   Date:   08/06/2022 MRN:  [id number] DOB: 07-22-1941    This 81 y.o. 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  REFERRING PROVIDER: JBaxter Hire MD  HPI: Patient is an 81year old male now out for months having completed IMRT radiation therapy for stage IIb adenocarcinoma the prostate.  Seen today in routine follow-up he is doing well well.  He specifically denies any increased lower urinary tract symptoms diarrhea or fatigue.  Most recent PSA this month is 0.11..Marland Kitchen COMPLICATIONS OF TREATMENT: none  FOLLOW UP COMPLIANCE: keeps appointments   PHYSICAL EXAM:  BP (!) (P) 151/82   Pulse (P) 67   Resp 18   Ht '5\' 10"'$  (1.778 m)   Wt 199 lb (90.3 kg)   BMI 28.55 kg/m  Well-developed well-nourished patient in NAD. HEENT reveals PERLA, EOMI, discs not visualized.  Oral cavity is clear. No oral mucosal lesions are identified. Neck is clear without evidence of cervical or supraclavicular adenopathy. Lungs are clear to A&P. Cardiac examination is essentially unremarkable with regular rate and rhythm without murmur rub or thrill. Abdomen is benign with no organomegaly or masses noted. Motor sensory and DTR levels are equal and symmetric in the upper and lower extremities. Cranial nerves II through XII are grossly intact. Proprioception is intact. No peripheral adenopathy or edema is identified. No motor or sensory levels are noted. Crude visual fields are within normal range.  RADIOLOGY RESULTS: No current films to review  PLAN: Present time patient is an excellent response to radiation treatments with significant response as far as PSA is concerned.  He has a low side effect profile.  Of asked to see him back in 6 months with a repeat PSA.  Patient knows to call with any concerns.  I  would like to take this opportunity to thank you for allowing me to participate in the care of your patient..Noreene Filbert MD

## 2022-08-07 ENCOUNTER — Ambulatory Visit: Payer: Medicare Other | Admitting: Radiation Oncology

## 2022-09-02 ENCOUNTER — Other Ambulatory Visit: Payer: Self-pay | Admitting: Dermatology

## 2022-12-09 ENCOUNTER — Other Ambulatory Visit: Payer: Medicare Other

## 2022-12-09 DIAGNOSIS — C61 Malignant neoplasm of prostate: Secondary | ICD-10-CM

## 2022-12-10 LAB — PSA: Prostate Specific Ag, Serum: <0.1 ng/mL (ref 0.0–4.0)

## 2022-12-11 ENCOUNTER — Ambulatory Visit: Payer: Medicare Other | Admitting: Urology

## 2022-12-11 ENCOUNTER — Encounter: Payer: Self-pay | Admitting: Urology

## 2022-12-11 VITALS — BP 154/78 | HR 64 | Ht 70.0 in | Wt 196.0 lb

## 2022-12-11 DIAGNOSIS — C61 Malignant neoplasm of prostate: Secondary | ICD-10-CM | POA: Diagnosis not present

## 2022-12-11 NOTE — Progress Notes (Signed)
12/11/2022 4:40 PM   Bobby Nichols [birth year] [id number]  Referring provider: Baxter Hire, MD [place],  [place] [address]  Chief Complaint  Patient presents with   Prostate Cancer   Urological history: 1.  T1c prostate cancer Benign biopsy 12/2012 for PSA in the 9-10 range MRI 2017 for PSA 13.8 showing PI-RADS 3 and 4 lesions Fusion biopsy multifocal high-grade PIN; focus of atypia suspicious for adenocarcinoma.  PSA 13.  Surveillance elected Repeat MRI 12/20, 38 cc gland, PI-RADS 5 lesion right mid gland, PI-RADS 3 lesion left mid gland Repeat MR fusion biopsy at [facility] 11/07/2019; volume 52.1 g.  4 cores each of ROI lesions were obtained along with 12 core standard biopsies; PSA 15.1  Pathology: 1/2 cores positive Gleason 3+4 adenocarcinoma left ROI (20%); 1/2 cores positive Gleason 3+3-second left ROI sample (20%); 1/4 cores with atypia right ROI; 12 core biopsy RML core positive Gleason 3+3 (10%); LB and RB cores with high-grade PIN Treated IMRT + ADT x6 months (leuprolide 01/17/2022; IMRT completed 03/13/2022)   HPI: 82 y.o. male presents for prostate cancer follow-up.  Tolerated radiation well.  Some increased urinary frequency which is improving.  Still has some tiredness and fatigue. PSA 06/16/2022-0.2; 07/29/2022-0.11; 12/09/2022 < 0.1   PMH: Past Medical History:  Diagnosis Date   Basal cell carcinoma (BCC) of back 11/17/2019   Left upper back   Basal cell carcinoma (BCC) of left shoulder 10/28/2018   EDC 11/16/2018   Basal cell carcinoma (BCC) of right side of neck 10/28/2018   EDC 11/16/2018   Diabetes mellitus without complication ([place])    Hyperlipidemia    Hypertension    Prostate cancer [facility])     Surgical History: Past Surgical History:  Procedure Laterality Date   HERNIA REPAIR      Home Medications:  Allergies as of 12/11/2022   No Known Allergies      Medication List        Accurate as of December 11, 2022  4:40 PM.  If you have any questions, ask your nurse or doctor.          amLODipine 5 MG tablet Commonly known as: NORVASC Take by mouth.   aspirin EC 81 MG tablet Take 81 mg by mouth daily.   atorvastatin 20 MG tablet Commonly known as: LIPITOR TK 1 T PO  QHS   fluocinonide 0.05 % external solution Commonly known as: LIDEX APP ON THE SKIN BID PRN   ketoconazole 2 % cream Commonly known as: NIZORAL APPLY TO FACE BID PRN   ketoconazole 2 % cream Commonly known as: NIZORAL APPLY TOPICALLY TO FACE TWICE DAILY AS NEEDED   ketoconazole 2 % shampoo Commonly known as: NIZORAL SHAMPOO 3 TIMES A WEEK, LATHER ON SCALP, LEAVE ON 8 TO 10 MINUTES RINSE WELL   multivitamin tablet Take 1 tablet by mouth daily.   NICOTINAMIDE PO Take by mouth.   OMEGA-3 FATTY ACIDS PO Take 500 mg by mouth.   omeprazole 20 MG capsule Commonly known as: PRILOSEC TK 1 C PO QD   PreviDent 5000 Booster Plus 1.1 % Pste Generic drug: Sodium Fluoride   propranolol 40 MG tablet Commonly known as: INDERAL Take by mouth.   tamsulosin 0.4 MG Caps capsule Commonly known as: FLOMAX Take 1 capsule (0.4 mg total) by mouth daily after supper.   triamcinolone cream 0.1 % Commonly known as: KENALOG Apply 1 application topically 2 (two) times daily as needed (Rash). For itchy areas on ears  and eyebrows use for up to 2 weeks. Avoid applying to face, groin, and axilla. Use as directed.        Allergies: No Known Allergies  Family History: Family History  Problem Relation Age of Onset   Prostate cancer Neg Hx    Bladder Cancer Neg Hx    Kidney cancer Neg Hx     Social History:  reports that he has never smoked. He has never used smokeless tobacco. He reports that he does not drink alcohol and does not use drugs.   Physical Exam: BP (!) 154/78   Pulse 64   Ht 5' 10"$  (1.778 m)   Wt 196 lb (88.9 kg)   BMI 28.12 kg/m   Constitutional:  Alert, No acute distress. HEENT: [place] AT Respiratory: Normal  respiratory effort, no increased work of breathing. Psychiatric: Normal mood and affect.   Assessment & Plan:    1.  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

## 2023-01-08 ENCOUNTER — Encounter: Payer: Self-pay | Admitting: Dermatology

## 2023-01-08 ENCOUNTER — Ambulatory Visit: Payer: Medicare Other | Admitting: Dermatology

## 2023-01-08 VITALS — BP 117/72 | HR 73

## 2023-01-08 DIAGNOSIS — L570 Actinic keratosis: Secondary | ICD-10-CM | POA: Diagnosis not present

## 2023-01-08 DIAGNOSIS — Z85828 Personal history of other malignant neoplasm of skin: Secondary | ICD-10-CM

## 2023-01-08 DIAGNOSIS — D229 Melanocytic nevi, unspecified: Secondary | ICD-10-CM

## 2023-01-08 DIAGNOSIS — L814 Other melanin hyperpigmentation: Secondary | ICD-10-CM

## 2023-01-08 DIAGNOSIS — Z1283 Encounter for screening for malignant neoplasm of skin: Secondary | ICD-10-CM

## 2023-01-08 DIAGNOSIS — L578 Other skin changes due to chronic exposure to nonionizing radiation: Secondary | ICD-10-CM

## 2023-01-08 NOTE — Progress Notes (Signed)
Follow-Up Visit   Subjective  Bobby Nichols is a 82 y.o. male who presents for the following: Skin Cancer Screening and Full Body Skin Exam, Hx of BCC. Patient c/o rough spots on his face.   The patient presents for Total-Body Skin Exam (TBSE) for skin cancer screening and mole check. The patient has spots, moles and lesions to be evaluated, some may be new or changing and the patient has concerns that these could be cancer.   The following portions of the chart were reviewed this encounter and updated as appropriate: medications, allergies, medical history  Review of Systems:  No other skin or systemic complaints except as noted in HPI or Assessment and Plan.  Objective  Well appearing patient in no apparent distress; mood and affect are within normal limits.  A full examination was performed including scalp, head, eyes, ears, nose, lips, neck, chest, axillae, abdomen, back, buttocks, bilateral upper extremities, bilateral lower extremities, hands, feet, fingers, toes, fingernails, and toenails. All findings within normal limits unless otherwise noted below.      Assessment & Plan   ACTINIC KERATOSIS  Actinic keratoses are precancerous spots that appear secondary to cumulative UV radiation exposure/sun exposure over time. They are chronic with expected duration over 1 year. A portion of actinic keratoses will progress to squamous cell carcinoma of the skin. It is not possible to reliably predict which spots will progress to skin cancer and so treatment is recommended to prevent development of skin cancer.  Recommend daily broad spectrum sunscreen SPF 30+ to sun-exposed areas, reapply every 2 hours as needed.  Recommend staying in the shade or wearing long sleeves, sun glasses (UVA+UVB protection) and wide brim hats (4-inch brim around the entire circumference of the hat). Call for new or changing lesions.  Prior to procedure, discussed risks of blister formation, small wound, skin  dyspigmentation, or rare scar following cryotherapy. Recommend Vaseline ointment to treated areas while healing.  Destruction Procedure Note Destruction method: cryotherapy   Informed consent: discussed and consent obtained   Lesion destroyed using liquid nitrogen: Yes   Cryotherapy cycles:  2 Outcome: patient tolerated procedure well with no complications   Post-procedure details: wound care instructions given   Locations: left eyebrow x 1, left preauricular x 2, left earlobe x 1, left helix x 1, right eyebrow x 1, right cheek x 1, right helix x 1, right lateral eyelid x 1, right forearm x 1 # of Lesions Treated: 10  Lentigines, Hemangiomas - Benign normal skin lesions - Benign-appearing - Call for any changes  Melanocytic Nevi - Tan-brown and/or pink-flesh-colored symmetric macules and papules - Benign appearing on exam today - Observation - Call clinic for new or changing moles - Recommend daily use of broad spectrum spf 30+ sunscreen to sun-exposed areas.   Actinic Damage - Chronic condition, secondary to cumulative UV/sun exposure - diffuse scaly erythematous macules with underlying dyspigmentation - Recommend daily broad spectrum sunscreen SPF 30+ to sun-exposed areas, reapply every 2 hours as needed.  - Staying in the shade or wearing long sleeves, sun glasses (UVA+UVB protection) and wide brim hats (4-inch brim around the entire circumference of the hat) are also recommended for sun protection.  - Call for new or changing lesions.  Seborrheic Keratoses - Stuck-on, waxy, tan-brown papules and/or plaques  - Benign-appearing - Discussed benign etiology and prognosis. - Observe - Call for any changes For raised dark spots at face, try applying diclofenac (voltaren) gel twice a day. You can also apply La  Roche Posay Effaclar once to twice a day as tolerated to thin these out some.  History of Basal Cell Carcinoma of the Skin Multiple see history - No evidence of recurrence  today - Recommend regular full body skin exams - Recommend daily broad spectrum sunscreen SPF 30+ to sun-exposed areas, reapply every 2 hours as needed.  - Call if any new or changing lesions are noted between office visits   Skin cancer screening performed today.  Return in about 3 months (around 04/10/2023) for Aks and TBSE in 12 months hx of BCC.    I, Marye Round, CMA, am acting as scribe for Forest Gleason, MD .   Documentation: I have reviewed the above documentation for accuracy and completeness, and I agree with the above.  Forest Gleason, MD

## 2023-01-08 NOTE — Patient Instructions (Addendum)
For raised dark spots at face, try applying diclofenac (voltaren) gel twice a day. You can also apply La Roche Posay Effaclar once to twice a day as tolerated to thin these out some.    Recommend taking Heliocare sun protection supplement daily in sunny weather for additional sun protection. For maximum protection on the sunniest days, you can take up to 2 capsules of regular Heliocare OR take 1 capsule of Heliocare Ultra. For prolonged exposure (such as a full day in the sun), you can repeat your dose of the supplement 4 hours after your first dose. Heliocare can be purchased at [employer], at some [facility] or at [email or web address].      Cryotherapy Aftercare  Wash gently with soap and water everyday.   Apply Vaseline and Band-Aid daily until healed.     Due to recent changes in healthcare laws, you may see results of your pathology and/or laboratory studies on MyChart before the doctors have had a chance to review them. We understand that in some cases there may be results that are confusing or concerning to you. Please understand that not all results are received at the same time and often the doctors may need to interpret multiple results in order to provide you with the best plan of care or course of treatment. Therefore, we ask that you please give [place] 2 business days to thoroughly review all your results before contacting the office for clarification. Should we see a critical lab result, you will be contacted sooner.   If You Need Anything After Your Visit  If you have any questions or concerns for your doctor, please call our main line at [phone] and press option 4 to reach your doctor's medical assistant. If no one answers, please leave a voicemail as directed and we will return your call as soon as possible. Messages left after 4 pm will be answered the following business day.   You may also send [place] a message via [place]. We typically respond to MyChart messages within  1-2 business days.  For prescription refills, please ask your pharmacy to contact our office. Our fax number is [phone].  If you have an urgent issue when the clinic is closed that cannot wait until the next business day, you can page your doctor at the number below.    Please note that while we do our best to be available for urgent issues outside of office hours, we are not available 24/7.   If you have an urgent issue and are unable to reach [place], you may choose to seek medical care at your doctor's office, retail clinic, urgent care center, or emergency room.  If you have a medical emergency, please immediately call 911 or go to the emergency department.  Pager Numbers  - Dr. Nehemiah Massed: [phone]  - Dr. Laurence Ferrari: [phone]  - Dr. Nicole Kindred: [phone]  In the event of inclement weather, please call our main line at [phone] for an update on the status of any delays or closures.  Dermatology Medication Tips: Please keep the boxes that topical medications come in in order to help keep track of the instructions about where and how to use these. Pharmacies typically print the medication instructions only on the boxes and not directly on the medication tubes.   If your medication is too expensive, please contact our office at [phone] option 4 or send [place] a message through [place].   We are unable to tell what your co-pay for medications will be in  advance as this is different depending on your insurance coverage. However, we may be able to find a substitute medication at lower cost or fill out paperwork to get insurance to cover a needed medication.   If a prior authorization is required to get your medication covered by your insurance company, please allow [place] 1-2 business days to complete this process.  Drug prices often vary depending on where the prescription is filled and some pharmacies may offer cheaper prices.  The website www.goodrx.com contains coupons for  medications through different pharmacies. The prices here do not account for what the cost may be with help from insurance (it may be cheaper with your insurance), but the website can give you the price if you did not use any insurance.  - You can print the associated coupon and take it with your prescription to the pharmacy.  - You may also stop by our office during regular business hours and pick up a GoodRx coupon card.  - If you need your prescription sent electronically to a different pharmacy, notify our office through [facility] or by phone at [phone] option 4.     Si Usted Necesita Algo Despus de Su Visita  Tambin puede enviarnos un mensaje a travs de Pharmacist, community. Por lo general respondemos a los mensajes de MyChart en el transcurso de 1 a 2 das hbiles.  Para renovar recetas, por favor pida a su farmacia que se ponga en contacto con nuestra oficina. Harland Dingwall de fax es [place] [phone].  Si tiene un asunto urgente cuando la clnica est cerrada y que no puede esperar hasta el siguiente da hbil, puede llamar/localizar a su doctor(a) al nmero que aparece a continuacin.   Por favor, tenga en cuenta que aunque hacemos todo lo posible para estar disponibles para asuntos urgentes fuera del horario de [place], no estamos disponibles las 24 horas del da, los 7 das de la [place].   Si tiene un problema urgente y no puede comunicarse con nosotros, puede optar por buscar atencin mdica  en el consultorio de su doctor(a), en una clnica privada, en un centro de atencin urgente o en una sala de emergencias.  Si tiene Engineering geologist, por favor llame inmediatamente al 911 o vaya a la sala de emergencias.  Nmeros de bper  - Dr. Nehemiah Massed: [phone]  - Dra. Moye: [phone]  - Dra. Nicole Kindred: [phone]  En caso de inclemencias del [place], por favor llame a Johnsie Kindred principal al [phone] para una actualizacin sobre el [place] de cualquier retraso o  cierre.  Consejos para la medicacin en dermatologa: Por favor, guarde las cajas en las que vienen los medicamentos de uso tpico para ayudarle a seguir las instrucciones sobre dnde y cmo usarlos. Las farmacias generalmente imprimen las instrucciones del medicamento slo en las cajas y no directamente en los tubos del [place].   Si su medicamento es muy caro, por favor, pngase en contacto con Zigmund Daniel llamando al [phone] y presione la opcin 4 o envenos un mensaje a travs de Pharmacist, community.   No podemos decirle cul ser su copago por los medicamentos por adelantado ya que esto es diferente dependiendo de la cobertura de su seguro. Sin embargo, es posible que podamos encontrar un medicamento sustituto a Electrical engineer un formulario para que el seguro cubra el medicamento que se considera necesario.   Si se requiere una autorizacin previa para que su compaa de seguros [place] su medicamento, por favor permtanos de 1 a 2  das hbiles para completar [place].  Los precios de los medicamentos varan con frecuencia dependiendo del Environmental consultant de dnde se surte la receta y alguna farmacias pueden ofrecer precios ms baratos.  El sitio web www.goodrx.com tiene cupones para medicamentos de Airline pilot. Los precios aqu no tienen en cuenta lo que podra costar con la ayuda del seguro (puede ser ms barato con su seguro), pero el sitio web puede darle el precio si no utiliz Research scientist (physical sciences).  - Puede imprimir el cupn correspondiente y llevarlo con su receta a la farmacia.  - Tambin puede pasar por nuestra oficina durante el horario de atencin regular y Charity fundraiser una tarjeta de cupones de GoodRx.  - Si necesita que su receta se enve electrnicamente a una farmacia diferente, informe a nuestra oficina a travs de MyChart de [place] o por telfono llamando al [phone] y presione la opcin 4.

## 2023-02-12 ENCOUNTER — Inpatient Hospital Stay: Payer: Medicare Other | Attending: Radiation Oncology

## 2023-02-12 DIAGNOSIS — Z923 Personal history of irradiation: Secondary | ICD-10-CM | POA: Diagnosis not present

## 2023-02-12 DIAGNOSIS — C61 Malignant neoplasm of prostate: Secondary | ICD-10-CM | POA: Diagnosis present

## 2023-02-13 ENCOUNTER — Other Ambulatory Visit: Payer: Medicare Other

## 2023-02-13 LAB — PSA: Prostatic Specific Antigen: 0.02 ng/mL (ref 0.00–4.00)

## 2023-02-16 ENCOUNTER — Other Ambulatory Visit: Payer: Medicare Other

## 2023-02-23 ENCOUNTER — Encounter: Payer: Self-pay | Admitting: Radiation Oncology

## 2023-02-23 ENCOUNTER — Ambulatory Visit
Admission: RE | Admit: 2023-02-23 | Discharge: 2023-02-23 | Disposition: A | Discharge status: Home / Self Care | Payer: Medicare Other | Source: Ambulatory Visit | Attending: Radiation Oncology | Admitting: Radiation Oncology

## 2023-02-23 VITALS — BP 142/76 | HR 62 | Temp 98.5°F | Resp 16 | Ht 70.0 in | Wt 201.0 lb

## 2023-02-23 DIAGNOSIS — Z923 Personal history of irradiation: Secondary | ICD-10-CM | POA: Diagnosis not present

## 2023-02-23 DIAGNOSIS — C61 Malignant neoplasm of prostate: Secondary | ICD-10-CM | POA: Insufficient documentation

## 2023-02-23 NOTE — Progress Notes (Signed)
Radiation Oncology Follow up Note  Name: Bobby Nichols   Date:   02/23/2023 MRN:  [id number] DOB: April 20, 1941    This 82 y.o. 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  REFERRING PROVIDER: Gracelyn Nurse, MD  HPI: Patient is an 82 year old male now out 10 months having completed IMRT radiation therapy to his prostate for Gleason 7 adenocarcinoma seen today in routine follow-up he is doing well.  He specifically denies any increased lower urinary tract symptoms diarrhea or fatigue.  His most recent PSA is.  0.02 down from 0.116 months prior.  COMPLICATIONS OF TREATMENT: none  FOLLOW UP COMPLIANCE: keeps appointments   PHYSICAL EXAM:  BP (!) 142/76 Comment: Patient monitors presuure at home and states it is WNL  Pulse 62   Temp 98.5 F (36.9 C) (Tympanic)   Resp 16   Ht 5\' 10"  (1.778 m) Comment: stated Ht  Wt 201 lb (91.2 kg)   BMI 28.84 kg/m  Well-developed well-nourished patient in NAD. HEENT reveals PERLA, EOMI, discs not visualized.  Oral cavity is clear. No oral mucosal lesions are identified. Neck is clear without evidence of cervical or supraclavicular adenopathy. Lungs are clear to A&P. Cardiac examination is essentially unremarkable with regular rate and rhythm without murmur rub or thrill. Abdomen is benign with no organomegaly or masses noted. Motor sensory and DTR levels are equal and symmetric in the upper and lower extremities. Cranial nerves II through XII are grossly intact. Proprioception is intact. No peripheral adenopathy or edema is identified. No motor or sensory levels are noted. Crude visual fields are within normal range.  RADIOLOGY RESULTS: No films for review  PLAN: Present time patient is under excellent biochemical control of his prostate cancer.  And pleased with his overall progress.  I have asked to see him  back in 6 months for follow-up.  If his PSA remains in this range we will start once year follow-up visits.  Patient is to call with any concerns.  I would like to take this opportunity to thank you for allowing me to participate in the care of your patient.Carmina Miller, MD

## 2023-03-02 ENCOUNTER — Encounter: Payer: Self-pay | Admitting: *Deleted

## 2023-04-09 ENCOUNTER — Ambulatory Visit: Payer: Medicare Other | Admitting: Dermatology

## 2023-05-04 ENCOUNTER — Ambulatory Visit: Payer: Medicare Other | Admitting: Dermatology

## 2023-05-04 VITALS — BP 118/63 | HR 66

## 2023-05-04 DIAGNOSIS — L820 Inflamed seborrheic keratosis: Secondary | ICD-10-CM

## 2023-05-04 DIAGNOSIS — W908XXA Exposure to other nonionizing radiation, initial encounter: Secondary | ICD-10-CM | POA: Diagnosis not present

## 2023-05-04 DIAGNOSIS — L578 Other skin changes due to chronic exposure to nonionizing radiation: Secondary | ICD-10-CM

## 2023-05-04 DIAGNOSIS — L570 Actinic keratosis: Secondary | ICD-10-CM | POA: Diagnosis not present

## 2023-05-04 DIAGNOSIS — L821 Other seborrheic keratosis: Secondary | ICD-10-CM

## 2023-05-04 DIAGNOSIS — D1801 Hemangioma of skin and subcutaneous tissue: Secondary | ICD-10-CM

## 2023-05-04 NOTE — Patient Instructions (Addendum)
 Actinic keratoses are precancerous spots that appear secondary to cumulative UV radiation exposure/sun exposure over time. They are chronic with expected duration over 1 year. A portion of actinic keratoses will progress to squamous cell carcinoma of the skin. It is not possible to reliably predict which spots will progress to skin cancer and so treatment is recommended to prevent development of skin cancer.  Recommend daily broad spectrum sunscreen SPF 30+ to sun-exposed areas, reapply every 2 hours as needed.  Recommend staying in the shade or wearing long sleeves, sun glasses (UVA+UVB protection) and wide brim hats (4-inch brim around the entire circumference of the hat). Call for new or changing lesions.   Cryotherapy Aftercare  Wash gently with soap and water everyday.   Apply Vaseline and Band-Aid daily until healed.   Seborrheic Keratosis  What causes seborrheic keratoses? Seborrheic keratoses are harmless, common skin growths that first appear during adult life.  As time goes by, more growths appear.  Some people may develop a large number of them.  Seborrheic keratoses appear on both covered and uncovered body parts.  They are not caused by sunlight.  The tendency to develop seborrheic keratoses can be inherited.  They vary in color from skin-colored to gray, brown, or even black.  They can be either smooth or have a rough, warty surface.   Seborrheic keratoses are superficial and look as if they were stuck on the skin.  Under the microscope this type of keratosis looks like layers upon layers of skin.  That is why at times the top layer may seem to fall off, but the rest of the growth remains and re-grows.    Treatment Seborrheic keratoses do not need to be treated, but can easily be removed in the office.  Seborrheic keratoses often cause symptoms when they rub on clothing or jewelry.  Lesions can be in the way of shaving.  If they become inflamed, they can cause itching, soreness, or  burning.  Removal of a seborrheic keratosis can be accomplished by freezing, burning, or surgery. If any spot bleeds, scabs, or grows rapidly, please return to have it checked, as these can be an indication of a skin cancer.          Due to recent changes in healthcare laws, you may see results of your pathology and/or laboratory studies on MyChart before the doctors have had a chance to review them. We understand that in some cases there may be results that are confusing or concerning to you. Please understand that not all results are received at the same time and often the doctors may need to interpret multiple results in order to provide you with the best plan of care or course of treatment. Therefore, we ask that you please give us 2 business days to thoroughly review all your results before contacting the office for clarification. Should we see a critical lab result, you will be contacted sooner.   If You Need Anything After Your Visit  If you have any questions or concerns for your doctor, please call our main line at [phone] and press option 4 to reach your doctor's medical assistant. If no one answers, please leave a voicemail as directed and we will return your call as soon as possible. Messages left after 4 pm will be answered the following business day.   You may also send us a message via MyChart. We typically respond to MyChart messages within 1-2 business days.  For prescription refills, please ask your pharmacy   to contact our office. Our fax number is [phone].  If you have an urgent issue when the clinic is closed that cannot wait until the next business day, you can page your doctor at the number below.    Please note that while we do our best to be available for urgent issues outside of office hours, we are not available 24/7.   If you have an urgent issue and are unable to reach us, you may choose to seek medical care at your doctor's office, retail clinic,  urgent care center, or emergency room.  If you have a medical emergency, please immediately call 911 or go to the emergency department.  Pager Numbers  - Dr. Kowalski: [phone]  - Dr. Moye: [phone]  - Dr. Stewart: [phone]  In the event of inclement weather, please call our main line at [phone] for an update on the status of any delays or closures.  Dermatology Medication Tips: Please keep the boxes that topical medications come in in order to help keep track of the instructions about where and how to use these. Pharmacies typically print the medication instructions only on the boxes and not directly on the medication tubes.   If your medication is too expensive, please contact our office at [phone] option 4 or send us a message through MyChart.   We are unable to tell what your co-pay for medications will be in advance as this is different depending on your insurance coverage. However, we may be able to find a substitute medication at lower cost or fill out paperwork to get insurance to cover a needed medication.   If a prior authorization is required to get your medication covered by your insurance company, please allow us 1-2 business days to complete this process.  Drug prices often vary depending on where the prescription is filled and some pharmacies may offer cheaper prices.  The website www.goodrx.com contains coupons for medications through different pharmacies. The prices here do not account for what the cost may be with help from insurance (it may be cheaper with your insurance), but the website can give you the price if you did not use any insurance.  - You can print the associated coupon and take it with your prescription to the pharmacy.  - You may also stop by our office during regular business hours and pick up a GoodRx coupon card.  - If you need your prescription sent electronically to a different pharmacy, notify our office through [place]  MyChart or by phone at [phone] option 4.     Si Usted Necesita Algo Despus de Su Visita  Tambin puede enviarnos un mensaje a travs de MyChart. Por lo general respondemos a los mensajes de MyChart en el transcurso de 1 a 2 das hbiles.  Para renovar recetas, por favor pida a su farmacia que se ponga en contacto con nuestra oficina. Nuestro nmero de fax es el [phone].  Si tiene un asunto urgente cuando la clnica est cerrada y que no puede esperar hasta el siguiente da hbil, puede llamar/localizar a su doctor(a) al nmero que aparece a continuacin.   Por favor, tenga en cuenta que aunque hacemos todo lo posible para estar disponibles para asuntos urgentes fuera del horario de oficina, no estamos disponibles las 24 horas del da, los 7 das de la semana.   Si tiene un problema urgente y no puede comunicarse con nosotros, puede optar por buscar atencin mdica  en el consultorio de su doctor(a), en una   clnica privada, en un centro de atencin urgente o en una sala de emergencias.  Si tiene una emergencia mdica, por favor llame inmediatamente al 911 o vaya a la sala de emergencias.  Nmeros de bper  - Dr. Kowalski: [phone]  - Dra. Moye: [phone]  - Dra. Stewart: [phone]  En caso de inclemencias del tiempo, por favor llame a nuestra lnea principal al [phone] para una actualizacin sobre el estado de cualquier retraso o cierre.  Consejos para la medicacin en dermatologa: Por favor, guarde las cajas en las que vienen los medicamentos de uso tpico para ayudarle a seguir las instrucciones sobre dnde y cmo usarlos. Las farmacias generalmente imprimen las instrucciones del medicamento slo en las cajas y no directamente en los tubos del medicamento.   Si su medicamento es muy caro, por favor, pngase en contacto con nuestra oficina llamando al [phone] y presione la opcin 4 o envenos un mensaje a travs de MyChart.   No podemos decirle cul  ser su copago por los medicamentos por adelantado ya que esto es diferente dependiendo de la cobertura de su seguro. Sin embargo, es posible que podamos encontrar un medicamento sustituto a menor costo o llenar un formulario para que el seguro cubra el medicamento que se considera necesario.   Si se requiere una autorizacin previa para que su compaa de seguros cubra su medicamento, por favor permtanos de 1 a 2 das hbiles para completar este proceso.  Los precios de los medicamentos varan con frecuencia dependiendo del lugar de dnde se surte la receta y alguna farmacias pueden ofrecer precios ms baratos.  El sitio web www.goodrx.com tiene cupones para medicamentos de diferentes farmacias. Los precios aqu no tienen en cuenta lo que podra costar con la ayuda del seguro (puede ser ms barato con su seguro), pero el sitio web puede darle el precio si no utiliz ningn seguro.  - Puede imprimir el cupn correspondiente y llevarlo con su receta a la farmacia.  - Tambin puede pasar por nuestra oficina durante el horario de atencin regular y recoger una tarjeta de cupones de GoodRx.  - Si necesita que su receta se enve electrnicamente a una farmacia diferente, informe a nuestra oficina a travs de MyChart de [place] o por telfono llamando al [phone] y presione la opcin 4.  

## 2023-05-04 NOTE — Progress Notes (Signed)
Follow-Up Visit   Subjective  Bobby Nichols is a 82 y.o. male who presents for the following: Reports spots at left and right side of face, right side of nose, brown spots at head, and some spots behind left ear.   The following portions of the chart were reviewed this encounter and updated as appropriate: medications, allergies, medical history  Review of Systems:  No other skin or systemic complaints except as noted in HPI or Assessment and Plan.  Objective  Well appearing patient in no apparent distress; mood and affect are within normal limits.   A focused examination was performed of the following areas: Face, b/l ears, nose  Relevant exam findings are noted in the Assessment and Plan.  right lower cheek x 1, right jaw x 2,  right forehead x 2, left lower cheek x 1,  left temple x 2, (8) Erythematous stuck-on, waxy papule   right nasal dorsum x 1 Erythematous thin papules/macules with gritty scale.     Assessment & Plan   ACTINIC DAMAGE - chronic, secondary to cumulative UV radiation exposure/sun exposure over time - diffuse scaly erythematous macules with underlying dyspigmentation - Recommend daily broad spectrum sunscreen SPF 30+ to sun-exposed areas, reapply every 2 hours as needed.  - Recommend staying in the shade or wearing long sleeves, sun glasses (UVA+UVB protection) and wide brim hats (4-inch brim around the entire circumference of the hat). - Call for new or changing lesions.  SEBORRHEIC KERATOSIS Behind left ear, face  - Stuck-on, waxy, tan-brown papules and/or plaques  - Benign-appearing - Discussed benign etiology and prognosis. - Observe - Call for any changes  HEMANGIOMA Exam: red papule(s) Discussed benign nature. Recommend observation. Call for changes.   Inflamed seborrheic keratosis (8) right lower cheek x 1, right jaw x 2,  right forehead x 2, left lower cheek x 1,  left temple x 2,  Symptomatic, irritating, patient would like  treated.  Destruction of lesion - right lower cheek x 1, right jaw x 2,  right forehead x 2, left lower cheek x 1,  left temple x 2, (8)  Destruction method: cryotherapy   Informed consent: discussed and consent obtained   Lesion destroyed using liquid nitrogen: Yes   Region frozen until ice ball extended beyond lesion: Yes   Outcome: patient tolerated procedure well with no complications   Post-procedure details: wound care instructions given   Additional details:  Prior to procedure, discussed risks of blister formation, small wound, skin dyspigmentation, or rare scar following cryotherapy. Recommend Vaseline ointment to treated areas while healing.   Actinic keratosis right nasal dorsum x 1  Actinic keratoses are precancerous spots that appear secondary to cumulative UV radiation exposure/sun exposure over time. They are chronic with expected duration over 1 year. A portion of actinic keratoses will progress to squamous cell carcinoma of the skin. It is not possible to reliably predict which spots will progress to skin cancer and so treatment is recommended to prevent development of skin cancer.  Recommend daily broad spectrum sunscreen SPF 30+ to sun-exposed areas, reapply every 2 hours as needed.  Recommend staying in the shade or wearing long sleeves, sun glasses (UVA+UVB protection) and wide brim hats (4-inch brim around the entire circumference of the hat). Call for new or changing lesions.  Destruction of lesion - right nasal dorsum x 1  Destruction method: cryotherapy   Informed consent: discussed and consent obtained   Lesion destroyed using liquid nitrogen: Yes   Region frozen until  ice ball extended beyond lesion: Yes   Outcome: patient tolerated procedure well with no complications   Post-procedure details: wound care instructions given   Additional details:  Prior to procedure, discussed risks of blister formation, small wound, skin dyspigmentation, or rare scar following  cryotherapy. Recommend Vaseline ointment to treated areas while healing.     Return for keep follow up as scheduled in March .  I, Asher Muir, CMA, am acting as scribe for Willeen Niece, MD.   Documentation: I have reviewed the above documentation for accuracy and completeness, and I agree with the above.  Willeen Niece, MD

## 2023-07-12 ENCOUNTER — Other Ambulatory Visit: Payer: Self-pay | Admitting: Dermatology

## 2023-08-03 ENCOUNTER — Ambulatory Visit: Payer: Medicare Other

## 2023-08-03 DIAGNOSIS — K6289 Other specified diseases of anus and rectum: Secondary | ICD-10-CM | POA: Diagnosis not present

## 2023-08-03 DIAGNOSIS — R1319 Other dysphagia: Secondary | ICD-10-CM | POA: Diagnosis not present

## 2023-08-03 DIAGNOSIS — K621 Rectal polyp: Secondary | ICD-10-CM | POA: Diagnosis not present

## 2023-08-03 DIAGNOSIS — Z860101 Personal history of adenomatous and serrated colon polyps: Secondary | ICD-10-CM | POA: Diagnosis not present

## 2023-08-03 DIAGNOSIS — K640 First degree hemorrhoids: Secondary | ICD-10-CM | POA: Diagnosis not present

## 2023-08-03 DIAGNOSIS — D122 Benign neoplasm of ascending colon: Secondary | ICD-10-CM | POA: Diagnosis not present

## 2023-08-03 DIAGNOSIS — K222 Esophageal obstruction: Secondary | ICD-10-CM | POA: Diagnosis not present

## 2023-08-03 DIAGNOSIS — K449 Diaphragmatic hernia without obstruction or gangrene: Secondary | ICD-10-CM | POA: Diagnosis not present

## 2023-08-03 DIAGNOSIS — Z8719 Personal history of other diseases of the digestive system: Secondary | ICD-10-CM | POA: Diagnosis not present

## 2023-08-03 DIAGNOSIS — Z09 Encounter for follow-up examination after completed treatment for conditions other than malignant neoplasm: Secondary | ICD-10-CM | POA: Diagnosis present

## 2023-08-03 DIAGNOSIS — K5730 Diverticulosis of large intestine without perforation or abscess without bleeding: Secondary | ICD-10-CM | POA: Diagnosis not present

## 2023-08-20 ENCOUNTER — Other Ambulatory Visit: Payer: Self-pay | Admitting: *Deleted

## 2023-08-20 DIAGNOSIS — C61 Malignant neoplasm of prostate: Secondary | ICD-10-CM

## 2023-08-26 ENCOUNTER — Inpatient Hospital Stay: Payer: Medicare Other | Attending: Radiation Oncology

## 2023-08-26 DIAGNOSIS — C61 Malignant neoplasm of prostate: Secondary | ICD-10-CM | POA: Diagnosis present

## 2023-08-26 LAB — PSA: Prostatic Specific Antigen: 0.05 ng/mL (ref 0.00–4.00)

## 2023-09-02 ENCOUNTER — Ambulatory Visit: Payer: Medicare Other | Admitting: Radiation Oncology

## 2023-09-07 ENCOUNTER — Encounter: Payer: Self-pay | Admitting: Radiation Oncology

## 2023-09-07 ENCOUNTER — Ambulatory Visit
Admission: RE | Admit: 2023-09-07 | Discharge: 2023-09-07 | Disposition: A | Discharge status: Home / Self Care | Payer: Medicare Other | Source: Ambulatory Visit | Attending: Radiation Oncology | Admitting: Radiation Oncology

## 2023-09-07 VITALS — BP 111/78 | HR 74 | Temp 96.8°F | Resp 16 | Ht 70.0 in | Wt 208.3 lb

## 2023-09-07 DIAGNOSIS — C61 Malignant neoplasm of prostate: Secondary | ICD-10-CM | POA: Diagnosis present

## 2023-09-07 DIAGNOSIS — Z923 Personal history of irradiation: Secondary | ICD-10-CM | POA: Insufficient documentation

## 2023-09-07 NOTE — Progress Notes (Signed)
Radiation Oncology Follow up Note  Name: Bobby Nichols   Date:   09/07/2023 MRN:  [id number] DOB: 02-Oct-1941    This 82 y.o. 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  REFERRING PROVIDER: Gracelyn Nurse, MD  HPI: The patient, an 82 year old with a history of stage 2B, Gleason 7 adenocarcinoma of the prostate, presents for a follow-up visit approximately 16 months after completing IMRT radiation therapy. The initial presentation was with a PSA in the fifteen range. The patient's most recent PSA is 0.05, down from 0.11 a year ago. He continues to follow-up with his urologist..  He specifically denies any increased lower Neri tract symptoms diarrhea or fatigue  COMPLICATIONS OF TREATMENT: none  FOLLOW UP COMPLIANCE: keeps appointments   PHYSICAL EXAM:  BP 111/78   Pulse 74   Temp (!) 96.8 F (36 C)   Resp 16   Ht 5\' 10"  (1.778 m)   Wt 208 lb 4.8 oz (94.5 kg)   BMI 29.89 kg/m  Well-developed well-nourished patient in NAD. HEENT reveals PERLA, EOMI, discs not visualized.  Oral cavity is clear. No oral mucosal lesions are identified. Neck is clear without evidence of cervical or supraclavicular adenopathy. Lungs are clear to A&P. Cardiac examination is essentially unremarkable with regular rate and rhythm without murmur rub or thrill. Abdomen is benign with no organomegaly or masses noted. Motor sensory and DTR levels are equal and symmetric in the upper and lower extremities. Cranial nerves II through XII are grossly intact. Proprioception is intact. No peripheral adenopathy or edema is identified. No motor or sensory levels are noted. Crude visual fields are within normal range.  RADIOLOGY RESULTS: No current films for review  PLAN: Prostate Adenocarcinoma (Stage 2B, Gleason 7, T2, N0, M0) Completed IMRT radiation therapy 16 months ago. PSA has  decreased from 0.11 to 0.05 over the past year. No urinary or bowel complaints. -Continue follow-up with urologist Dr. Caryl Never. -Return for oncology follow-up in one year. If PSA remains low, transition to urology for ongoing follow-up.    Carmina Miller, MD

## 2023-12-11 ENCOUNTER — Other Ambulatory Visit: Payer: Medicare Other

## 2023-12-15 ENCOUNTER — Ambulatory Visit: Payer: Medicare Other | Admitting: Urology

## 2024-01-14 ENCOUNTER — Encounter: Payer: Medicare Other | Admitting: Dermatology

## 2024-01-21 ENCOUNTER — Ambulatory Visit: Admitting: Dermatology

## 2024-01-21 ENCOUNTER — Encounter: Payer: Self-pay | Admitting: Dermatology

## 2024-01-21 DIAGNOSIS — Z1283 Encounter for screening for malignant neoplasm of skin: Secondary | ICD-10-CM

## 2024-01-21 DIAGNOSIS — D692 Other nonthrombocytopenic purpura: Secondary | ICD-10-CM

## 2024-01-21 DIAGNOSIS — L821 Other seborrheic keratosis: Secondary | ICD-10-CM

## 2024-01-21 DIAGNOSIS — D492 Neoplasm of unspecified behavior of bone, soft tissue, and skin: Secondary | ICD-10-CM

## 2024-01-21 DIAGNOSIS — C4491 Basal cell carcinoma of skin, unspecified: Secondary | ICD-10-CM

## 2024-01-21 DIAGNOSIS — L817 Pigmented purpuric dermatosis: Secondary | ICD-10-CM

## 2024-01-21 DIAGNOSIS — L578 Other skin changes due to chronic exposure to nonionizing radiation: Secondary | ICD-10-CM

## 2024-01-21 DIAGNOSIS — L814 Other melanin hyperpigmentation: Secondary | ICD-10-CM | POA: Diagnosis not present

## 2024-01-21 DIAGNOSIS — D1801 Hemangioma of skin and subcutaneous tissue: Secondary | ICD-10-CM

## 2024-01-21 DIAGNOSIS — L820 Inflamed seborrheic keratosis: Secondary | ICD-10-CM | POA: Diagnosis not present

## 2024-01-21 DIAGNOSIS — L570 Actinic keratosis: Secondary | ICD-10-CM | POA: Diagnosis not present

## 2024-01-21 DIAGNOSIS — W908XXA Exposure to other nonionizing radiation, initial encounter: Secondary | ICD-10-CM | POA: Diagnosis not present

## 2024-01-21 DIAGNOSIS — Z85828 Personal history of other malignant neoplasm of skin: Secondary | ICD-10-CM

## 2024-01-21 DIAGNOSIS — C44619 Basal cell carcinoma of skin of left upper limb, including shoulder: Secondary | ICD-10-CM

## 2024-01-21 DIAGNOSIS — D229 Melanocytic nevi, unspecified: Secondary | ICD-10-CM

## 2024-01-21 HISTORY — DX: Basal cell carcinoma of skin, unspecified: C44.91

## 2024-01-21 NOTE — Progress Notes (Signed)
 Follow-Up Visit   Subjective  Bobby Nichols is a 83 y.o. male who presents for the following: Skin Cancer Screening and Full Body Skin Exam. Hx of actinic keratoses. Hx of BCCs.  Spots on face of concern. Raised, rough, scaly.   The patient presents for Total-Body Skin Exam (TBSE) for skin cancer screening and mole check. The patient has spots, moles and lesions to be evaluated, some may be new or changing and the patient may have concern these could be cancer.    The following portions of the chart were reviewed this encounter and updated as appropriate: medications, allergies, medical history  Review of Systems:  No other skin or systemic complaints except as noted in HPI or Assessment and Plan.  Objective  Well appearing patient in no apparent distress; mood and affect are within normal limits.  A full examination was performed including scalp, head, eyes, ears, nose, lips, neck, chest, axillae, abdomen, back, buttocks, bilateral upper extremities, bilateral lower extremities, hands, feet, fingers, toes, fingernails, and toenails. All findings within normal limits unless otherwise noted below.   Relevant physical exam findings are noted in the Assessment and Plan.  L lateral cheek x3, L earlobe x3, R temple x1, R medial cheek x1, R lat cheek x1, R earlobe x2, R upper eyelid x1, R dorsal hand x1, L dorsal hand x2, L forearm x2 (17) Erythematous thin papules/macules with gritty scale.  Right Lateral Cheek x1 Erythematous keratotic or waxy stuck-on papule or plaque. Left Upper Extensor Arm 7 mm pink scaly scar-like macule   Assessment & Plan   SKIN CANCER SCREENING PERFORMED TODAY.  HISTORY OF BASAL CELL CARCINOMA OF THE SKIN. Left upper back, L shoulder, R side of neck.  - No evidence of recurrence today - Recommend regular full body skin exams - Recommend daily broad spectrum sunscreen SPF 30+ to sun-exposed areas, reapply every 2 hours as needed.  - Call if any new or  changing lesions are noted between office visits   ACTINIC DAMAGE - Chronic condition, secondary to cumulative UV/sun exposure - diffuse scaly erythematous macules with underlying dyspigmentation - Recommend daily broad spectrum sunscreen SPF 30+ to sun-exposed areas, reapply every 2 hours as needed.  - Staying in the shade or wearing long sleeves, sun glasses (UVA+UVB protection) and wide brim hats (4-inch brim around the entire circumference of the hat) are also recommended for sun protection.  - Call for new or changing lesions.  LENTIGINES, SEBORRHEIC KERATOSES, HEMANGIOMAS - Benign normal skin lesions - Benign-appearing - Call for any changes  MELANOCYTIC NEVI - Tan-brown and/or pink-flesh-colored symmetric macules and papules - Benign appearing on exam today - Observation - Call clinic for new or changing moles - Recommend daily use of broad spectrum spf 30+ sunscreen to sun-exposed areas.    Pigmented Purpura - Chronic; persistent and recurrent.  Treatable, but not curable. - Violaceous macules and patches. B/L lower legs - Benign - Related to trauma, age, sun damage and/or use of blood thinners, chronic use of topical and/or oral steroids - Observe - Can use OTC arnica containing moisturizer such as Dermend Bruise Formula if desired - Call for worsening or other concerns    AK (ACTINIC KERATOSIS) (17) L lateral cheek x3, L earlobe x3, R temple x1, R medial cheek x1, R lat cheek x1, R earlobe x2, R upper eyelid x1, R dorsal hand x1, L dorsal hand x2, L forearm x2 (17) Actinic keratoses are precancerous spots that appear secondary to cumulative UV radiation exposure/sun  exposure over time. They are chronic with expected duration over 1 year. A portion of actinic keratoses will progress to squamous cell carcinoma of the skin. It is not possible to reliably predict which spots will progress to skin cancer and so treatment is recommended to prevent development of skin  cancer.  Recommend daily broad spectrum sunscreen SPF 30+ to sun-exposed areas, reapply every 2 hours as needed.  Recommend staying in the shade or wearing long sleeves, sun glasses (UVA+UVB protection) and wide brim hats (4-inch brim around the entire circumference of the hat). Call for new or changing lesions. Destruction of lesion - L lateral cheek x3, L earlobe x3, R temple x1, R medial cheek x1, R lat cheek x1, R earlobe x2, R upper eyelid x1, R dorsal hand x1, L dorsal hand x2, L forearm x2 (17) Complexity: simple   Destruction method: cryotherapy   Informed consent: discussed and consent obtained   Timeout:  patient name, date of birth, surgical site, and procedure verified Lesion destroyed using liquid nitrogen: Yes   Region frozen until ice ball extended beyond lesion: Yes   Cryo cycles: 1 or 2. Outcome: patient tolerated procedure well with no complications   Post-procedure details: wound care instructions given   Additional details:  Prior to procedure, discussed risks of blister formation, small wound, skin dyspigmentation, or rare scar following cryotherapy. Recommend Vaseline ointment to treated areas while healing.  INFLAMED SEBORRHEIC KERATOSIS Right Lateral Cheek x1 Symptomatic, irritating, patient would like treated. Destruction of lesion - Right Lateral Cheek x1 Complexity: simple   Destruction method: cryotherapy   Informed consent: discussed and consent obtained   Timeout:  patient name, date of birth, surgical site, and procedure verified Lesion destroyed using liquid nitrogen: Yes   Region frozen until ice ball extended beyond lesion: Yes   Cryo cycles: 1 or 2. Outcome: patient tolerated procedure well with no complications   Post-procedure details: wound care instructions given   Additional details:  Prior to procedure, discussed risks of blister formation, small wound, skin dyspigmentation, or rare scar following cryotherapy. Recommend Vaseline ointment to  treated areas while healing.  NEOPLASM OF SKIN Left Upper Extensor Arm Skin / nail biopsy Type of biopsy: tangential   Informed consent: discussed and consent obtained   Timeout: patient name, date of birth, surgical site, and procedure verified   Procedure prep:  Patient was prepped and draped in usual sterile fashion Prep type:  Isopropyl alcohol Anesthesia: the lesion was anesthetized in a standard fashion   Anesthetic:  1% lidocaine w/ epinephrine 1-100,000 buffered w/ 8.4% NaHCO3 Instrument used: DermaBlade   Hemostasis achieved with: pressure and aluminum chloride   Outcome: patient tolerated procedure well   Post-procedure details: sterile dressing applied and wound care instructions given   Dressing type: bandage and petrolatum   Specimen 1 - Surgical pathology Differential Diagnosis: AK vs BCC  Check Margins: No MULTIPLE BENIGN NEVI   LENTIGINES   SEBORRHEIC KERATOSES   ACTINIC ELASTOSIS   CHERRY ANGIOMA   PIGMENTED PURPURIC DERMATOSIS    Return in about 1 year (around 01/20/2025) for TBSE, HxBCC.  I, Lawson Radar, CMA, am acting as scribe for Elie Goody, MD.   Documentation: I have reviewed the above documentation for accuracy and completeness, and I agree with the above.  Elie Goody, MD

## 2024-01-21 NOTE — Patient Instructions (Addendum)
 Cryotherapy Aftercare  Wash gently with soap and water everyday.   Apply Vaseline Jelly daily until healed.    Wound Care Instructions  Cleanse wound gently with soap and water once a day then pat dry with clean gauze. Apply a thin coat of Petrolatum (petroleum jelly, "Vaseline") over the wound (unless you have an allergy to this). We recommend that you use a new, sterile tube of Vaseline. Do not pick or remove scabs. Do not remove the yellow or white "healing tissue" from the base of the wound.  Cover the wound with fresh, clean, nonstick gauze and secure with paper tape. You may use Band-Aids in place of gauze and tape if the wound is small enough, but would recommend trimming much of the tape off as there is often too much. Sometimes Band-Aids can irritate the skin.  You should call the office for your biopsy report after 1 week if you have not already been contacted.  If you experience any problems, such as abnormal amounts of bleeding, swelling, significant bruising, significant pain, or evidence of infection, please call the office immediately.  FOR ADULT SURGERY PATIENTS: If you need something for pain relief you may take 1 extra strength Tylenol (acetaminophen) AND 2 Ibuprofen (200mg  each) together every 4 hours as needed for pain. (do not take these if you are allergic to them or if you have a reason you should not take them.) Typically, you may only need pain medication for 1 to 3 days.      Recommend daily broad spectrum sunscreen SPF 30+ to sun-exposed areas, reapply every 2 hours as needed. Call for new or changing lesions.  Staying in the shade or wearing long sleeves, sun glasses (UVA+UVB protection) and wide brim hats (4-inch brim around the entire circumference of the hat) are also recommended for sun protection.    Melanoma ABCDEs  Melanoma is the most dangerous type of skin cancer, and is the leading cause of death from skin disease.  You are more likely to develop  melanoma if you: Have light-colored skin, light-colored eyes, or red or blond hair Spend a lot of time in the sun Tan regularly, either outdoors or in a tanning bed Have had blistering sunburns, especially during childhood Have a close family member who has had a melanoma Have atypical moles or large birthmarks  Early detection of melanoma is key since treatment is typically straightforward and cure rates are extremely high if we catch it early.   The first sign of melanoma is often a change in a mole or a new dark spot.  The ABCDE system is a way of remembering the signs of melanoma.  A for asymmetry:  The two halves do not match. B for border:  The edges of the growth are irregular. C for color:  A mixture of colors are present instead of an even brown color. D for diameter:  Melanomas are usually (but not always) greater than 6mm - the size of a pencil eraser. E for evolution:  The spot keeps changing in size, shape, and color.  Please check your skin once per month between visits. You can use a small mirror in front and a large mirror behind you to keep an eye on the back side or your body.   If you see any new or changing lesions before your next follow-up, please call to schedule a visit.  Please continue daily skin protection including broad spectrum sunscreen SPF 30+ to sun-exposed areas, reapplying every 2 hours  as needed when you're outdoors.   Staying in the shade or wearing long sleeves, sun glasses (UVA+UVB protection) and wide brim hats (4-inch brim around the entire circumference of the hat) are also recommended for sun protection.     Due to recent changes in healthcare laws, you may see results of your pathology and/or laboratory studies on MyChart before the doctors have had a chance to review them. We understand that in some cases there may be results that are confusing or concerning to you. Please understand that not all results are received at the same time and often  the doctors may need to interpret multiple results in order to provide you with the best plan of care or course of treatment. Therefore, we ask that you please give [place] 2 business days to thoroughly review all your results before contacting the office for clarification. Should we see a critical lab result, you will be contacted sooner.   If You Need Anything After Your Visit  If you have any questions or concerns for your doctor, please call our main line at [phone] and press option 4 to reach your doctor's medical assistant. If no one answers, please leave a voicemail as directed and we will return your call as soon as possible. Messages left after 4 pm will be answered the following business day.   You may also send [place] a message via MyChart. We typically respond to MyChart messages within 1-2 business days.  For prescription refills, please ask your pharmacy to contact our office. Our fax number is [phone].  If you have an urgent issue when the clinic is closed that cannot wait until the next business day, you can page your doctor at the number below.    Please note that while we do our best to be available for urgent issues outside of office hours, we are not available 24/7.   If you have an urgent issue and are unable to reach [place], you may choose to seek medical care at your doctor's office, retail clinic, urgent care center, or emergency room.  If you have a medical emergency, please immediately call 911 or go to the emergency department.  Pager Numbers  - Dr. Gwen Pounds: [phone]  - Dr. Roseanne Reno: [phone]  - Dr. Katrinka Blazing: [phone]   In the event of inclement weather, please call our main line at [phone] for an update on the status of any delays or closures.  Dermatology Medication Tips: Please keep the boxes that topical medications come in in order to help keep track of the instructions about where and how to use these. Pharmacies typically print the medication  instructions only on the boxes and not directly on the medication tubes.   If your medication is too expensive, please contact our office at [phone] option 4 or send [place] a message through MyChart.   We are unable to tell what your co-pay for medications will be in advance as this is different depending on your insurance coverage. However, we may be able to find a substitute medication at lower cost or fill out paperwork to get insurance to cover a needed medication.   If a prior authorization is required to get your medication covered by your insurance company, please allow [place] 1-2 business days to complete this process.  Drug prices often vary depending on where the prescription is filled and some pharmacies may offer cheaper prices.  The website www.goodrx.com contains coupons for medications through different pharmacies. The prices here do not account  for what the cost may be with help from insurance (it may be cheaper with your insurance), but the website can give you the price if you did not use any insurance.  - You can print the associated coupon and take it with your prescription to the pharmacy.  - You may also stop by our office during regular business hours and pick up a GoodRx coupon card.  - If you need your prescription sent electronically to a different pharmacy, notify our office through [facility] or by phone at [phone] option 4.     Si Usted Necesita Algo Despus de Su Visita  Tambin puede enviarnos un mensaje a travs de Clinical cytogeneticist. Por lo general respondemos a los mensajes de MyChart en el transcurso de 1 a 2 das hbiles.  Para renovar recetas, por favor pida a su farmacia que se ponga en contacto con nuestra oficina. Annie Sable de fax es [place] [phone].  Si tiene un asunto urgente cuando la clnica est cerrada y que no puede esperar hasta el siguiente da hbil, puede llamar/localizar a su doctor(a) al nmero que aparece a continuacin.   Por  favor, tenga en cuenta que aunque hacemos todo lo posible para estar disponibles para asuntos urgentes fuera del horario de [place], no estamos disponibles las 24 horas del da, los 7 [address]  [address] de la [place].   Si tiene un problema urgente y no puede comunicarse con nosotros, puede optar por buscar atencin mdica  en el consultorio de su doctor(a), en una clnica privada, en un centro de atencin urgente o en una sala de emergencias.  Si tiene Engineer, drilling, por favor llame inmediatamente al 911 o vaya a la sala de emergencias.  Nmeros de bper  - Dr. Gwen Pounds: [phone]  - Dra. Roseanne Reno: [phone]  - Dr. Katrinka Blazing: [phone]   En caso de inclemencias del tiempo, por favor llame a Lacy Duverney principal al [phone] para una actualizacin sobre el [place] de cualquier retraso o cierre.  Consejos para la medicacin en dermatologa: Por favor, guarde las cajas en las que vienen los medicamentos de uso tpico para ayudarle a seguir las instrucciones sobre dnde y cmo usarlos. Las farmacias generalmente imprimen las instrucciones del medicamento slo en las cajas y no directamente en los tubos del [place].   Si su medicamento es muy caro, por favor, pngase en contacto con Rolm Gala llamando al [phone] y presione la opcin 4 o envenos un mensaje a travs de Clinical cytogeneticist.   No podemos decirle cul ser su copago por los medicamentos por adelantado ya que esto es diferente dependiendo de la cobertura de su seguro. Sin embargo, es posible que podamos encontrar un medicamento sustituto a Audiological scientist un formulario para que el seguro cubra el medicamento que se considera necesario.   Si se requiere una autorizacin previa para que su compaa de seguros [place] su medicamento, por favor permtanos de 1 a 2 das hbiles para completar [address]  Los precios de los medicamentos varan con frecuencia dependiendo del Environmental consultant de dnde se surte la receta y alguna farmacias  pueden ofrecer precios ms baratos.  El sitio web www.goodrx.com tiene cupones para medicamentos de Health and safety inspector. Los precios aqu no tienen en cuenta lo que podra costar con la ayuda del seguro (puede ser ms barato con su seguro), pero el sitio web puede darle el precio si no utiliz Tourist information centre manager.  - Puede imprimir el cupn correspondiente y llevarlo con su receta a la farmacia.  -  Tambin puede pasar por nuestra oficina durante el horario de atencin regular y Education officer, museum una tarjeta de cupones de GoodRx.  - Si necesita que su receta se enve electrnicamente a una farmacia diferente, informe a nuestra oficina a travs de MyChart de [place] o por telfono llamando al [phone] y presione la opcin 4.

## 2024-01-25 LAB — SURGICAL PATHOLOGY

## 2024-02-01 ENCOUNTER — Telehealth: Payer: Self-pay

## 2024-02-01 NOTE — Telephone Encounter (Signed)
-----   Message from [facility] Melanie L sent at 02/01/2024  8:33 AM EDT -----  ----- Message ----- From: Artemio Larry, MD Sent: 01/26/2024   6:31 PM EDT To: Annette Barters Clinical  1. Skin, left upper extensor arm :       SUPERFICIAL BASAL CELL CARCINOMA   Thin BCC skin cancer- needs EDC - please call patient

## 2024-02-16 ENCOUNTER — Ambulatory Visit: Admitting: Dermatology

## 2024-02-16 ENCOUNTER — Encounter: Payer: Self-pay | Admitting: Dermatology

## 2024-02-16 DIAGNOSIS — C44619 Basal cell carcinoma of skin of left upper limb, including shoulder: Secondary | ICD-10-CM | POA: Diagnosis not present

## 2024-02-16 NOTE — Progress Notes (Signed)
   Follow-Up Visit   Subjective  Bobby Nichols is a 83 y.o. male who presents for the following: Pt here today to treat bx proven superficial BCC of the L upper extensor arm with ED&C.    The following portions of the chart were reviewed this encounter and updated as appropriate: medications, allergies, medical history  Review of Systems:  No other skin or systemic complaints except as noted in HPI or Assessment and Plan.  Objective  Well appearing patient in no apparent distress; mood and affect are within normal limits.   A focused examination was performed of the following areas: the face and L arm   Relevant exam findings are noted in the Assessment and Plan.  L upper extensor arm Pink bx site.  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 INCLUDING SHOULDER L upper extensor arm Destruction of lesion Complexity: extensive   Destruction method: electrodesiccation and curettage   Informed consent: discussed and consent obtained   Timeout:  patient name, date of birth, surgical site, and procedure verified Procedure prep:  Patient was prepped and draped in usual sterile fashion Prep type:  Isopropyl alcohol Anesthesia: the lesion was anesthetized in a standard fashion   Anesthetic:  1% lidocaine w/ epinephrine 1-100,000 buffered w/ 8.4% NaHCO3 Curettage performed in three different directions: Yes   Electrodesiccation performed over the curetted area: Yes   Final wound size (cm):  1.7 Hemostasis achieved with:  pressure, aluminum chloride and electrodesiccation Outcome: patient tolerated procedure well with no complications   Post-procedure details: sterile dressing applied and wound care instructions given   Dressing type: bandage and petrolatum    Return for appointment as scheduled.  Arlinda Lais, CMA, am acting as scribe for Harris Liming, MD .  Documentation: I have reviewed the above documentation for accuracy and  completeness, and I agree with the above.  Harris Liming, MD

## 2024-02-16 NOTE — Patient Instructions (Signed)

## 2024-02-23 ENCOUNTER — Other Ambulatory Visit: Payer: Self-pay

## 2024-02-25 ENCOUNTER — Other Ambulatory Visit: Payer: Self-pay

## 2024-02-25 DIAGNOSIS — C61 Malignant neoplasm of prostate: Secondary | ICD-10-CM

## 2024-02-29 ENCOUNTER — Other Ambulatory Visit

## 2024-02-29 DIAGNOSIS — C61 Malignant neoplasm of prostate: Secondary | ICD-10-CM

## 2024-03-02 ENCOUNTER — Encounter: Payer: Self-pay | Admitting: Urology

## 2024-03-02 ENCOUNTER — Ambulatory Visit: Payer: Self-pay | Admitting: Urology

## 2024-03-02 VITALS — BP 126/74 | HR 65 | Ht 70.0 in | Wt 196.0 lb

## 2024-03-02 DIAGNOSIS — C61 Malignant neoplasm of prostate: Secondary | ICD-10-CM

## 2024-03-02 LAB — PSA: Prostate Specific Ag, Serum: <0.1 ng/mL (ref 0.0–4.0)

## 2024-03-02 NOTE — Progress Notes (Signed)
 I, Bobby Nichols, acting as a Neurosurgeon for Bobby Knapp, MD., have documented all relevant documentation on the behalf of Bobby Knapp, MD, as directed by Bobby Knapp, MD while in the presence of Bobby Knapp, MD.  Discussed the use of AI scribe software for clinical note transcription with the patient, who gave verbal consent to proceed.   03/02/2024 3:17 PM   Bobby Nichols 1941/03/26 [id number]  Referring provider: Little Riff, MD [address] [facility] [place],  [place] [address]  Chief Complaint  Patient presents with   Prostate Cancer   Urological history: 1.  T1c prostate cancer -Benign biopsy 12/2012 for PSA in the 9-10 range -MRI 2017 for PSA 13.8 showing PI-RADS 3 and 4 lesions -Fusion biopsy multifocal high-grade PIN; focus of atypia suspicious for adenocarcinoma.  PSA 13.  Surveillance elected -Repeat MRI 12/20, 38 cc gland, PI-RADS 5 lesion right mid gland, PI-RADS 3 lesion left mid gland -Repeat MR fusion biopsy at [facility] 11/07/2019; volume 52.1 g.  4 cores each of ROI lesions were obtained along with 12 core standard biopsies; PSA 15.1  -Pathology: 1/2 cores positive Gleason 3+4 adenocarcinoma left ROI (20%); 1/2 cores positive Gleason 3+3-second left ROI sample (20%); 1/4 cores with atypia right ROI; 12 core biopsy RML core positive Gleason 3+3 (10%); LB and RB cores with high-grade PIN -Treated IMRT + ADT x6 months (leuprolide  01/17/2022; IMRT completed 03/13/2022)  HPI: Bobby Nichols is a 83 y.o. male presents for annual follow-up.   Seen by radiation oncology November 2024, and PSA was 0.05 No problem since last visit and has no complaints today.  No bothersome lower urinary tract symptoms, and he is no longer taking tamsulosin .  PSA 02/29/2024 undetectable at <0.1  PSA trend   Prostate Specific Ag, Serum  Latest Ref Rng 0.0 - 4.0 ng/mL  05/07/2020 15.1 (H)   11/12/2020 11.7 (H)   12/09/2021 20.4 (H)   06/16/2022 0.2   12/09/2022  <0.1   02/29/2024 <0.1      PMH: Past Medical History:  Diagnosis Date   Basal cell carcinoma (BCC) of back 11/17/2019   Left upper back   Basal cell carcinoma (BCC) of left shoulder 10/28/2018   [facility] 11/16/2018   Basal cell carcinoma (BCC) of right side of neck 10/28/2018   EDC 11/16/2018   BCC (basal cell carcinoma) 01/21/2024   left upper extensor arm, superficial, EDC scheduled 02/16/24   Diabetes mellitus without complication (HCC)    Hyperlipidemia    Hypertension    Prostate cancer [facility])     Surgical History: Past Surgical History:  Procedure Laterality Date   HERNIA REPAIR      Home Medications:  Allergies as of 03/02/2024   No Known Allergies      Medication List        Accurate as of Mar 02, 2024  3:17 PM. If you have any questions, ask your nurse or doctor.          amLODipine 5 MG tablet Commonly known as: NORVASC Take by mouth.   aspirin EC 81 MG tablet Take 81 mg by mouth daily.   atorvastatin 20 MG tablet Commonly known as: LIPITOR TK 1 T PO  QHS   fluocinonide 0.05 % external solution Commonly known as: LIDEX APP ON THE SKIN BID PRN   fluticasone 50 MCG/ACT nasal spray Commonly known as: FLONASE Place 2 sprays into both nostrils daily.   ketoconazole  2 % cream Commonly known  as: NIZORAL  APPLY TOPICALLY TO FACE TWICE DAILY AS NEEDED   ketoconazole  2 % shampoo Commonly known as: NIZORAL  LATHER ON SCALP, LEAVE ON FOR 8 TO 10 MINUTES, RINSE WELL, USE TO SHAMPOO 3 TIMES A WEEK   multivitamin tablet Take 1 tablet by mouth daily.   Na Sulfate-K Sulfate-Mg Sulfate concentrate 17.5-3.13-1.6 GM/177ML Soln Commonly known as: SUPREP Take by mouth.   NICOTINAMIDE PO Take by mouth.   OMEGA-3 FATTY ACIDS PO Take 500 mg by mouth.   omeprazole 20 MG capsule Commonly known as: PRILOSEC TK 1 C PO QD   Paxlovid (300/100) 20 x 150 MG & 10 x 100MG  Tbpk Generic drug: nirmatrelvir/ritonavir Take by mouth.   PreviDent 5000 Booster Plus 1.1 %  Pste Generic drug: Sodium Fluoride   propranolol 40 MG tablet Commonly known as: INDERAL Take by mouth.   tamsulosin  0.4 MG Caps capsule Commonly known as: FLOMAX  Take 1 capsule (0.4 mg total) by mouth daily after supper.   triamcinolone  cream 0.1 % Commonly known as: KENALOG  Apply 1 application topically 2 (two) times daily as needed (Rash). For itchy areas on ears and eyebrows use for up to 2 weeks. Avoid applying to face, groin, and axilla. Use as directed.        Allergies: No Known Allergies  Family History: Family History  Problem Relation Age of Onset   Prostate cancer Neg Hx    Bladder Cancer Neg Hx    Kidney cancer Neg Hx     Social History:  reports that he has never smoked. He has never used smokeless tobacco. He reports that he does not drink alcohol and does not use drugs.   Physical Exam: BP 126/74   Pulse 65   Ht 5\' 10"  (1.778 m)   Wt 196 lb (88.9 kg)   BMI 28.12 kg/m   Constitutional:  Alert and oriented, No acute distress. HEENT: [place] AT Respiratory: Normal respiratory effort, no increased work of breathing. Psychiatric: Normal mood and affect.  Assessment & Plan:    1. 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  I have reviewed the above documentation for accuracy and completeness, and I agree with the above.   Bobby Knapp, MD  [facility] [address], [address] [place], [place] [address] [phone]

## 2024-05-03 ENCOUNTER — Telehealth: Payer: Self-pay | Admitting: *Deleted

## 2024-05-03 NOTE — Telephone Encounter (Signed)
 Patient called today and he did not leave the message so I called him and he says that he has to reschedule the appointments in November and at the time he did not have his piece of paper that says which dates and times so he would like someone to call him tomorrow so that they could get it set up that was best for him.  He has an appointment on 11/17 for labs and 11/24 to see Dr. Lenn. Please call him tomorrow because he will have the days he wants to change it to. Telephone  [phone]

## 2024-05-04 NOTE — Telephone Encounter (Signed)
 Contacted patient regarding November appointments the issue is resolved.

## 2024-06-19 ENCOUNTER — Other Ambulatory Visit: Payer: Self-pay | Admitting: Dermatology

## 2024-07-26 ENCOUNTER — Ambulatory Visit: Admitting: Dermatology

## 2024-07-27 ENCOUNTER — Telehealth: Payer: Self-pay

## 2024-07-27 ENCOUNTER — Ambulatory Visit: Admitting: Dermatology

## 2024-07-27 ENCOUNTER — Encounter: Payer: Self-pay | Admitting: Dermatology

## 2024-07-27 DIAGNOSIS — L821 Other seborrheic keratosis: Secondary | ICD-10-CM

## 2024-07-27 DIAGNOSIS — L820 Inflamed seborrheic keratosis: Secondary | ICD-10-CM

## 2024-07-27 DIAGNOSIS — L814 Other melanin hyperpigmentation: Secondary | ICD-10-CM | POA: Diagnosis not present

## 2024-07-27 DIAGNOSIS — W908XXA Exposure to other nonionizing radiation, initial encounter: Secondary | ICD-10-CM

## 2024-07-27 DIAGNOSIS — L80 Vitiligo: Secondary | ICD-10-CM

## 2024-07-27 DIAGNOSIS — D1801 Hemangioma of skin and subcutaneous tissue: Secondary | ICD-10-CM

## 2024-07-27 DIAGNOSIS — Z1283 Encounter for screening for malignant neoplasm of skin: Secondary | ICD-10-CM

## 2024-07-27 DIAGNOSIS — D229 Melanocytic nevi, unspecified: Secondary | ICD-10-CM

## 2024-07-27 DIAGNOSIS — L578 Other skin changes due to chronic exposure to nonionizing radiation: Secondary | ICD-10-CM | POA: Diagnosis not present

## 2024-07-27 DIAGNOSIS — L219 Seborrheic dermatitis, unspecified: Secondary | ICD-10-CM

## 2024-07-27 DIAGNOSIS — Z85828 Personal history of other malignant neoplasm of skin: Secondary | ICD-10-CM

## 2024-07-27 MED ORDER — OPZELURA 1.5 % EX CREA: TOPICAL_CREAM | CUTANEOUS | 2 refills | Status: AC

## 2024-07-27 NOTE — Telephone Encounter (Signed)
 Opezlura not covered.  With PA (if approved) will be expensive due to age and insurance - he will not qualify for copay card. aw

## 2024-07-27 NOTE — Progress Notes (Unsigned)
 Follow-Up Visit   Subjective  Bobby Nichols is a 83 y.o. male who presents for the following: Skin Cancer Screening and Full Body Skin Exam  The patient presents for Total-Body Skin Exam (TBSE) for skin cancer screening and mole check. The patient has spots, moles and lesions to be evaluated, some may be new or changing and the patient may have concern these could be cancer.  Hx BCC. Patient with a few spots at temples that get sore. Arms seem to have more lighter spots after treating with LN2 at ;last visit. Patient moving back to [place] at the end of November.   The following portions of the chart were reviewed this encounter and updated as appropriate: medications, allergies, medical history  Review of Systems:  No other skin or systemic complaints except as noted in HPI or Assessment and Plan.  Objective  Well appearing patient in no apparent distress; mood and affect are within normal limits.  A full examination was performed including scalp, head, eyes, ears, nose, lips, neck, chest, axillae, abdomen, back, buttocks, bilateral upper extremities, bilateral lower extremities, hands, feet, fingers, toes, fingernails, and toenails. All findings within normal limits unless otherwise noted below.   Relevant physical exam findings are noted in the Assessment and Plan.  Right Temple/scalp x 6, central forehead x 2 (8) Erythematous stuck-on, waxy papule or plaque  Assessment & Plan   SKIN CANCER SCREENING PERFORMED TODAY.  ACTINIC DAMAGE - Chronic condition, secondary to cumulative UV/sun exposure - diffuse scaly erythematous macules with underlying dyspigmentation - Recommend daily broad spectrum sunscreen SPF 30+ to sun-exposed areas, reapply every 2 hours as needed.  - Staying in the shade or wearing long sleeves, sun glasses (UVA+UVB protection) and wide brim hats (4-inch brim around the entire circumference of the hat) are also recommended for sun protection.  - Call for new or  changing lesions.  LENTIGINES, SEBORRHEIC KERATOSES, HEMANGIOMAS - Benign normal skin lesions - Benign-appearing - Call for any changes  MELANOCYTIC NEVI - Tan-brown and/or pink-flesh-colored symmetric macules and papules - Benign appearing on exam today - Observation - Call clinic for new or changing moles - Recommend daily use of broad spectrum spf 30+ sunscreen to sun-exposed areas.   HISTORY OF BASAL CELL CARCINOMA OF THE SKIN. Left upper back, L shoulder, R side of neck.  - No evidence of recurrence today - Recommend regular full body skin exams - Recommend daily broad spectrum sunscreen SPF 30+ to sun-exposed areas, reapply every 2 hours as needed.  - Call if any new or changing lesions are noted between office visits  VITILIGO Exam: depigmented patches on arms, neck, cheeks  Vitiligo is a chronic autoimmune condition which causes loss of skin pigment and is commonly seen on the face and may also involve areas of trauma like hands, elbows, knees, and ankles. There is no cure and it is difficult to treat.  Treatments include topical steroids and other topical anti-inflammatory ointments/creams and topical and oral Jak inhibitors.  Sometimes narrow band UV light therapy or Xtrac laser is helpful, both of which require twice weekly treatments for at least 3-6 months.  Antioxidant vitamins, such as Vitamins A,C,E,D, Folic Acid and B12 may be added to enhance treatment. Heliocare may also enhance treatment results.  Treatment Plan: Discussed that late onset vitiligo can be a precursor or indicator of melanoma. Any new dark spots should be evaluated and biopsied. Patient must establish with new dermatologist and start skin checks immediately after moving   Start Opzelura  to aa vitiligo twice every day. If not covered or too expensive, patient will let us  know and we can send in a topical steroid.   Patient having labs done 08/23/24. Patient advised to have them check TSH and Free  T4.  Patient moving back to [place] next month. Recommend he get set up with a dermatologist to monitor.  INFLAMED SEBORRHEIC KERATOSIS (8) Right Temple/scalp x 6, central forehead x 2 (8) Symptomatic, irritating, patient would like treated.  Benign-appearing.  Call clinic for new or changing lesions.   Destruction of lesion - Right Temple/scalp x 6, central forehead x 2 (8) Complexity: simple   Destruction method: cryotherapy   Informed consent: discussed and consent obtained   Timeout:  patient name, date of birth, surgical site, and procedure verified Lesion destroyed using liquid nitrogen: Yes   Region frozen until ice ball extended beyond lesion: Yes   Cryo cycles: 1 or 2. Outcome: patient tolerated procedure well with no complications   Post-procedure details: wound care instructions given    MULTIPLE BENIGN NEVI   LENTIGINES   ACTINIC ELASTOSIS   SEBORRHEIC KERATOSES   CHERRY ANGIOMA   VITILIGO   Return if symptoms worsen or fail to improve, for Patient moving to [place].  LILLETTE Lonell Drones, RMA, am acting as scribe for Boneta Sharps, MD .   Documentation: I have reviewed the above documentation for accuracy and completeness, and I agree with the above.  Boneta Sharps, MD

## 2024-07-27 NOTE — Patient Instructions (Addendum)
 Labs to have checked with next blood work - TSH and Free T4   Cryotherapy Aftercare  Wash gently with soap and water everyday.   Apply Vaseline and Band-Aid daily until healed.   Melanoma ABCDEs  Melanoma is the most dangerous type of skin cancer, and is the leading cause of death from skin disease.  You are more likely to develop melanoma if you: Have light-colored skin, light-colored eyes, or red or blond hair Spend a lot of time in the sun Tan regularly, either outdoors or in a tanning bed Have had blistering sunburns, especially during childhood Have a close family member who has had a melanoma Have atypical moles or large birthmarks  Early detection of melanoma is key since treatment is typically straightforward and cure rates are extremely high if we catch it early.   The first sign of melanoma is often a change in a mole or a new dark spot.  The ABCDE system is a way of remembering the signs of melanoma.  A for asymmetry:  The two halves do not match. B for border:  The edges of the growth are irregular. C for color:  A mixture of colors are present instead of an even brown color. D for diameter:  Melanomas are usually (but not always) greater than 6mm - the size of a pencil eraser. E for evolution:  The spot keeps changing in size, shape, and color.  Please check your skin once per month between visits. You can use a small mirror in front and a large mirror behind you to keep an eye on the back side or your body.   If you see any new or changing lesions before your next follow-up, please call to schedule a visit.  Please continue daily skin protection including broad spectrum sunscreen SPF 30+ to sun-exposed areas, reapplying every 2 hours as needed when you're outdoors.    Due to recent changes in healthcare laws, you may see results of your pathology and/or laboratory studies on MyChart before the doctors have had a chance to review them. We understand that in some cases  there may be results that are confusing or concerning to you. Please understand that not all results are received at the same time and often the doctors may need to interpret multiple results in order to provide you with the best plan of care or course of treatment. Therefore, we ask that you please give us  2 business days to thoroughly review all your results before contacting the office for clarification. Should we see a critical lab result, you will be contacted sooner.   If You Need Anything After Your Visit  If you have any questions or concerns for your doctor, please call our main line at [phone] and press option 4 to reach your doctor's medical assistant. If no one answers, please leave a voicemail as directed and we will return your call as soon as possible. Messages left after 4 pm will be answered the following business day.   You may also send us  a message via MyChart. We typically respond to MyChart messages within 1-2 business days.  For prescription refills, please ask your pharmacy to contact our office. Our fax number is [phone].  If you have an urgent issue when the clinic is closed that cannot wait until the next business day, you can page your doctor at the number below.    Please note that while we do our best to be available for urgent issues outside of  office hours, we are not available 24/7.   If you have an urgent issue and are unable to reach us , you may choose to seek medical care at your doctor's office, retail clinic, urgent care center, or emergency room.  If you have a medical emergency, please immediately call 911 or go to the emergency department.  Pager Numbers  - Dr. Hester: [phone]  - Dr. Jackquline: [phone]  - Dr. Claudene: [phone]   - Dr. Raymund: [phone]  In the event of inclement weather, please call our main line at [phone] for an update on the status of any delays or closures.  Dermatology Medication Tips: Please  keep the boxes that topical medications come in in order to help keep track of the instructions about where and how to use these. Pharmacies typically print the medication instructions only on the boxes and not directly on the medication tubes.   If your medication is too expensive, please contact our office at [phone] option 4 or send us  a message through MyChart.   We are unable to tell what your co-pay for medications will be in advance as this is different depending on your insurance coverage. However, we may be able to find a substitute medication at lower cost or fill out paperwork to get insurance to cover a needed medication.   If a prior authorization is required to get your medication covered by your insurance company, please allow us  1-2 business days to complete this process.  Drug prices often vary depending on where the prescription is filled and some pharmacies may offer cheaper prices.  The website www.goodrx.com contains coupons for medications through different pharmacies. The prices here do not account for what the cost may be with help from insurance (it may be cheaper with your insurance), but the website can give you the price if you did not use any insurance.  - You can print the associated coupon and take it with your prescription to the pharmacy.  - You may also stop by our office during regular business hours and pick up a GoodRx coupon card.  - If you need your prescription sent electronically to a different pharmacy, notify our office through [facility] or by phone at [phone] option 4.     Si Usted Necesita Algo Despus de Su Visita  Tambin puede enviarnos un mensaje a travs de Clinical cytogeneticist. Por lo general respondemos a los mensajes de MyChart en el transcurso de 1 a 2 das hbiles.  Para renovar recetas, por favor pida a su farmacia que se ponga en contacto con nuestra oficina. Randi lakes de fax es [place] [phone].  Si tiene un asunto urgente  cuando la clnica est cerrada y que no puede esperar hasta el siguiente da hbil, puede llamar/localizar a su doctor(a) al nmero que aparece a continuacin.   Por favor, tenga en cuenta que aunque hacemos todo lo posible para estar disponibles para asuntos urgentes fuera del horario de [place], no estamos disponibles las 24 horas del da, los 7 [address]  [address] de la [place].   Si tiene un problema urgente y no puede comunicarse con nosotros, puede optar por buscar atencin mdica  en el consultorio de su doctor(a), en una clnica privada, en un centro de atencin urgente o en una sala de emergencias.  Si tiene Engineer, drilling, por favor llame inmediatamente al 911 o vaya a la sala de emergencias.  Nmeros de bper  - Dr. Hester: [phone]  - Dra. Jackquline: [phone]  - Dr. Claudene:  [phone]  - Dra. Kitts: [phone]  En caso de inclemencias del [place], por favor llame a nuestra lnea principal al [phone] para una actualizacin sobre el estado de cualquier retraso o cierre.  Consejos para la medicacin en dermatologa: Por favor, guarde las cajas en las que vienen los medicamentos de uso tpico para ayudarle a seguir las instrucciones sobre dnde y cmo usarlos. Las farmacias generalmente imprimen las instrucciones del medicamento slo en las cajas y no directamente en los tubos del [place].   Si su medicamento es muy caro, por favor, pngase en contacto con landry rieger llamando al [phone] y presione la opcin 4 o envenos un mensaje a travs de Clinical cytogeneticist.   No podemos decirle cul ser su copago por los medicamentos por adelantado ya que esto es diferente dependiendo de la cobertura de su seguro. Sin embargo, es posible que podamos encontrar un medicamento sustituto a Audiological scientist un formulario para que el seguro cubra el medicamento que se considera necesario.   Si se requiere una autorizacin previa para que su compaa de seguros malta su medicamento, por  favor permtanos de 1 a 2 das hbiles para completar este proceso.  Los precios de los medicamentos varan con frecuencia dependiendo del Environmental consultant de dnde se surte la receta y alguna farmacias pueden ofrecer precios ms baratos.  El sitio web www.goodrx.com tiene cupones para medicamentos de Health and safety inspector. Los precios aqu no tienen en cuenta lo que podra costar con la ayuda del seguro (puede ser ms barato con su seguro), pero el sitio web puede darle el precio si no utiliz Tourist information centre manager.  - Puede imprimir el cupn correspondiente y llevarlo con su receta a la farmacia.  - Tambin puede pasar por nuestra oficina durante el horario de atencin regular y Education officer, museum una tarjeta de cupones de GoodRx.  - Si necesita que su receta se enve electrnicamente a una farmacia diferente, informe a nuestra oficina a travs de MyChart de [place] o por telfono llamando al [phone] y presione la opcin 4.

## 2024-07-28 ENCOUNTER — Encounter: Payer: Self-pay | Admitting: Dermatology

## 2024-07-28 MED ORDER — TRIAMCINOLONE ACETONIDE 0.1 % EX CREA: 1.0000 | TOPICAL_CREAM | Freq: Two times a day (BID) | CUTANEOUS | 0 refills | Status: DC | PRN

## 2024-07-28 NOTE — Telephone Encounter (Signed)
 [facility] sent in and patient advised. aw

## 2024-08-01 ENCOUNTER — Ambulatory Visit: Admitting: Dermatology

## 2024-08-10 ENCOUNTER — Inpatient Hospital Stay: Attending: Radiation Oncology

## 2024-08-10 DIAGNOSIS — C61 Malignant neoplasm of prostate: Secondary | ICD-10-CM | POA: Insufficient documentation

## 2024-08-10 LAB — PSA: Prostatic Specific Antigen: <0.02 ng/mL (ref 0.00–4.00)

## 2024-08-17 ENCOUNTER — Ambulatory Visit
Admission: RE | Admit: 2024-08-17 | Discharge: 2024-08-17 | Disposition: A | Discharge status: Home / Self Care | Source: Ambulatory Visit | Attending: Radiation Oncology | Admitting: Radiation Oncology

## 2024-08-17 ENCOUNTER — Encounter: Payer: Self-pay | Admitting: Radiation Oncology

## 2024-08-17 VITALS — BP 116/78 | HR 65 | Temp 97.5°F | Resp 16 | Wt 202.0 lb

## 2024-08-17 DIAGNOSIS — Z923 Personal history of irradiation: Secondary | ICD-10-CM | POA: Insufficient documentation

## 2024-08-17 DIAGNOSIS — C61 Malignant neoplasm of prostate: Secondary | ICD-10-CM | POA: Diagnosis present

## 2024-08-17 NOTE — Progress Notes (Signed)
 Radiation Oncology Follow up Note  Name: Bobby Nichols   Date:   08/17/2024 MRN:  [id number] DOB: 10/10/1941    This 83 y.o. 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  REFERRING PROVIDER: Rudolpho Norleen BIRCH, MD  HPI: Patient is an 83 year old male now out 28 months having completed IMRT radiation therapy for Gleason 7 adenocarcinoma the prostate.  Seen today in routine follow-up he is doing well specifically denies any increased lower urinary tract symptoms diarrhea or fatigue.  His most recent PSA remains undetectable at less than 0.02.  COMPLICATIONS OF TREATMENT: none  FOLLOW UP COMPLIANCE: keeps appointments   PHYSICAL EXAM:  BP 116/78   Pulse 65   Temp (!) 97.5 F (36.4 C) (Tympanic)   Resp 16   Wt 202 lb (91.6 kg)   BMI 28.98 kg/m  Well-developed well-nourished patient in NAD. HEENT reveals PERLA, EOMI, discs not visualized.  Oral cavity is clear. No oral mucosal lesions are identified. Neck is clear without evidence of cervical or supraclavicular adenopathy. Lungs are clear to A&P. Cardiac examination is essentially unremarkable with regular rate and rhythm without murmur rub or thrill. Abdomen is benign with no organomegaly or masses noted. Motor sensory and DTR levels are equal and symmetric in the upper and lower extremities. Cranial nerves II through XII are grossly intact. Proprioception is intact. No peripheral adenopathy or edema is identified. No motor or sensory levels are noted. Crude visual fields are within normal range.  RADIOLOGY RESULTS: No current films for review  PLAN: Present time patient is doing well under excellent biochemical control of his prostate cancer out over 2 years since IMRT radiation.  Patient is moving to [place] .  We will forward any records he needs for continuity of care there.   Patient knows to call with any concerns at any time.  I have asked to make sure his new physician least runs a PSA at least once a year.  I would like to take this opportunity to thank you for allowing me to participate in the care of your patient.SABRA Marcey Penton, MD

## 2024-08-20 ENCOUNTER — Other Ambulatory Visit: Payer: Self-pay | Admitting: Dermatology

## 2024-08-20 DIAGNOSIS — L219 Seborrheic dermatitis, unspecified: Secondary | ICD-10-CM

## 2024-09-05 ENCOUNTER — Other Ambulatory Visit: Payer: Medicare Other

## 2024-09-12 ENCOUNTER — Ambulatory Visit: Payer: Medicare Other | Admitting: Radiation Oncology

## 2025-03-02 ENCOUNTER — Other Ambulatory Visit

## 2025-03-03 ENCOUNTER — Ambulatory Visit: Admitting: Urology
